# Patient Record
Sex: Male | Born: 1950 | Race: White | Hispanic: No | Marital: Married | Smoking: Never smoker
Health system: Southern US, Community
[De-identification: ages and names within clinical notes are randomized; demographics above are authoritative.]

## PROBLEM LIST (undated history)

## (undated) DIAGNOSIS — E785 Hyperlipidemia, unspecified: Secondary | ICD-10-CM

## (undated) DIAGNOSIS — R42 Dizziness and giddiness: Secondary | ICD-10-CM

## (undated) DIAGNOSIS — I4819 Other persistent atrial fibrillation: Secondary | ICD-10-CM

## (undated) DIAGNOSIS — K219 Gastro-esophageal reflux disease without esophagitis: Secondary | ICD-10-CM

## (undated) DIAGNOSIS — R001 Bradycardia, unspecified: Secondary | ICD-10-CM

## (undated) HISTORY — DX: Bradycardia, unspecified: R00.1

## (undated) HISTORY — DX: Gastro-esophageal reflux disease without esophagitis: K21.9

## (undated) HISTORY — DX: Other persistent atrial fibrillation: I48.19

## (undated) HISTORY — DX: Dizziness and giddiness: R42

## (undated) HISTORY — DX: Hyperlipidemia, unspecified: E78.5

## (undated) HISTORY — PX: TONSILLECTOMY: SUR1361

## (undated) HISTORY — PX: COLONOSCOPY: SHX174

---

## 2008-08-30 ENCOUNTER — Ambulatory Visit: Payer: Self-pay | Admitting: Cardiology

## 2008-08-31 ENCOUNTER — Encounter: Payer: Self-pay | Admitting: Cardiology

## 2008-09-05 ENCOUNTER — Encounter (INDEPENDENT_AMBULATORY_CARE_PROVIDER_SITE_OTHER): Payer: Self-pay | Admitting: *Deleted

## 2008-09-05 LAB — CONVERTED CEMR LAB
AST: 20 units/L
Alkaline Phosphatase: 46 units/L
BUN: 14 mg/dL
Basophils Absolute: 0.1 10*3/uL
Basophils Relative: 1 %
Calcium: 9.6 mg/dL
Cholesterol: 210 mg/dL
Creatinine, Ser: 1.26 mg/dL
Glucose, Bld: 94 mg/dL
HCT: 46 %
MCHC: 33.9 g/dL
MCV: 97.9 fL
Platelets: 226 10*3/uL
Potassium: 4.4 meq/L
RDW: 12.8 %
TSH: 3.753 microintl units/mL
Triglycerides: 149 mg/dL

## 2008-09-15 ENCOUNTER — Ambulatory Visit: Payer: Self-pay | Admitting: Cardiology

## 2008-10-06 ENCOUNTER — Ambulatory Visit: Payer: Self-pay | Admitting: Cardiology

## 2009-10-25 ENCOUNTER — Encounter (INDEPENDENT_AMBULATORY_CARE_PROVIDER_SITE_OTHER): Payer: Self-pay | Admitting: *Deleted

## 2009-10-26 ENCOUNTER — Ambulatory Visit: Payer: Self-pay | Admitting: Cardiology

## 2009-10-26 ENCOUNTER — Encounter (INDEPENDENT_AMBULATORY_CARE_PROVIDER_SITE_OTHER): Payer: Self-pay | Admitting: *Deleted

## 2009-10-26 DIAGNOSIS — K219 Gastro-esophageal reflux disease without esophagitis: Secondary | ICD-10-CM

## 2009-10-26 DIAGNOSIS — E785 Hyperlipidemia, unspecified: Secondary | ICD-10-CM

## 2009-12-05 ENCOUNTER — Encounter: Payer: Self-pay | Admitting: Cardiology

## 2009-12-05 LAB — CONVERTED CEMR LAB
HDL: 33 mg/dL — ABNORMAL LOW (ref >39)
Total CHOL/HDL Ratio: 4.6

## 2009-12-11 ENCOUNTER — Encounter (INDEPENDENT_AMBULATORY_CARE_PROVIDER_SITE_OTHER): Payer: Self-pay | Admitting: *Deleted

## 2010-04-10 NOTE — Letter (Signed)
Summary: [place] [facility] Work Engineer, agricultural at [employer]  [address] [place] [address]   Phone: [phone]  Fax: [phone]     October 26, 2009 MRN: [id number]   Keith Luna [address] [place], [place]  [address]      YOUR LAB WORK IS DUE   November 27, 2009  Please go to [facility], located across the street from [facility] on the second floor.  Hours are Monday - Friday 7am until 7:30pm         Saturday 8am until 12noon    _X_  DO NOT EAT OR DRINK AFTER MIDNIGHT EVENING PRIOR TO LABWORK  __ YOUR LABWORK IS NOT FASTING --YOU MAY EAT PRIOR TO LABWORK

## 2010-04-10 NOTE — Miscellaneous (Signed)
Summary: LABS CBCD,CMP,LIPIDS,09/05/2008  Clinical Lists Changes  Observations: Added new observation of CALCIUM: 9.6 mg/dL (16/12/9602 54:09) Added new observation of ALBUMIN: 4.2 g/dL (81/19/1478 29:56) Added new observation of PROTEIN, TOT: 6.8 g/dL (21/30/8657 84:69) Added new observation of SGPT (ALT): 20 units/L (09/05/2008 16:41) Added new observation of SGOT (AST): 20 units/L (09/05/2008 16:41) Added new observation of ALK PHOS: 46 units/L (09/05/2008 16:41) Added new observation of CREATININE: 1.26 mg/dL (62/95/2841 32:44) Added new observation of BUN: 14 mg/dL (03/13/7251 66:44) Added new observation of BG RANDOM: 94 mg/dL (03/47/4259 56:38) Added new observation of CO2 PLSM/SER: 23 meq/L (09/05/2008 16:41) Added new observation of CL SERUM: 105 meq/L (09/05/2008 16:41) Added new observation of K SERUM: 4.4 meq/L (09/05/2008 16:41) Added new observation of NA: 141 meq/L (09/05/2008 16:41) Added new observation of LDL: 135 mg/dL (75/64/3329 51:88) Added new observation of HDL: 45 mg/dL (41/66/0630 16:01) Added new observation of TRIGLYC TOT: 149 mg/dL (09/32/3557 32:20) Added new observation of CHOLESTEROL: 210 mg/dL (25/42/7062 37:62) Added new observation of TSH: 3.753 microintl units/mL (09/05/2008 16:41) Added new observation of ABSOLUTE BAS: 0.1 K/uL (09/05/2008 16:41) Added new observation of BASOPHIL %: 1 % (09/05/2008 16:41) Added new observation of EOS ABSLT: 0.1 K/uL (09/05/2008 16:41) Added new observation of % EOS AUTO: 2 % (09/05/2008 16:41) Added new observation of ABSOLUTE MON: 0.5 K/uL (09/05/2008 16:41) Added new observation of MONOCYTE %: 8 % (09/05/2008 16:41) Added new observation of ABS LYMPHOCY: 1.8 K/uL (09/05/2008 16:41) Added new observation of LYMPHS %: 31 % (09/05/2008 16:41) Added new observation of PLATELETK/UL: 226 K/uL (09/05/2008 16:41) Added new observation of RDW: 12.8 % (09/05/2008 16:41) Added new observation of MCHC RBC: 33.9 g/dL  (83/15/1761 60:73) Added new observation of MCV: 97.9 fL (09/05/2008 16:41) Added new observation of HCT: 46.0 % (09/05/2008 16:41) Added new observation of HGB: 15.6 g/dL (71/08/2692 85:46) Added new observation of RBC M/UL: 4.70 M/uL (09/05/2008 16:41) Added new observation of WBC COUNT: 5.8 10*3/microliter (09/05/2008 16:41)

## 2010-04-10 NOTE — Miscellaneous (Signed)
Summary: GXT 10/06/08  Clinical Lists Changes  Observations: Added new observation of ETTFINDING:  REFERRING PHYSICIAN:  Fara Chute      CLINICAL DATA:  A 60 year old gentleman with a history of dizziness and   syncope, who had no increase in heart rate on a 6-minute walk test   performed recently.   1. Treadmill exercise performed to a workload of 12.5 METS and a heart       rate of 164, 101% of age predicted maximum.  Exercise discontinued       due to dyspnea and fatigue; no chest discomfort reported.   2. Blood pressure increased from a resting value of 150/80-170/80 at       peak exercise, a normal response.   3. No arrhythmias noted.   4. EKG:  Sinus bradycardia; within normal limits.      STRESS EKG:  Insignificant upsloping ST-segment depression resolving   quickly in recovery.      IMPRESSION:  Negative and adequate graded exercise test.            Gerrit Friends. Dietrich Pates, MD, [facility]  (10/06/2008 16:40)      Exercise Stress Test  Procedure date:  10/06/2008  Findings:       REFERRING PHYSICIAN:  Fara Chute      CLINICAL DATA:  A 60 year old gentleman with a history of dizziness and   syncope, who had no increase in heart rate on a 6-minute walk test   performed recently.   1. Treadmill exercise performed to a workload of 12.5 METS and a heart       rate of 164, 101% of age predicted maximum.  Exercise discontinued       due to dyspnea and fatigue; no chest discomfort reported.   2. Blood pressure increased from a resting value of 150/80-170/80 at       peak exercise, a normal response.   3. No arrhythmias noted.   4. EKG:  Sinus bradycardia; within normal limits.      STRESS EKG:  Insignificant upsloping ST-segment depression resolving   quickly in recovery.      IMPRESSION:  Negative and adequate graded exercise test.            Gerrit Friends. Dietrich Pates, MD, [facility]

## 2010-04-10 NOTE — Assessment & Plan Note (Signed)
Summary: F1Y   Visit Type:  Follow-up Primary Provider:  Dr. Fara Chute   History of Present Illness: Keith Luna returns to the office as scheduled for reassessment of orthostatic lightheadedness.  Since increasing salt intake, symptoms have been minimal.  Keith Luna has not experienced syncope nor falls.  He is active, maintaining a large garden, without any difficulty or any cardiopulmonary symptoms.  He has been so compliant with his high salt diet that he has developed mild peripheral edema.  Current Medications (verified): 1)  Lipitor 40 Mg Tabs (Atorvastatin Calcium) .... Take One Tablet By Mouth Daily. 2)  Nexium 40 Mg Pack (Esomeprazole Magnesium) .... Take 1 Tab Daily 3)  Tylenol 325 Mg Tabs (Acetaminophen) .... Take As Needed  Allergies (verified): No Known Drug Allergies  Past History:  PMH, FH, and Social History reviewed and updated.  Past Medical History: Orthostatic lightheadedness; transient neurologic symptoms-2010 Hyperlipidemia Gastroesophageal reflux disease  Review of Systems       The patient complains of peripheral edema.  The patient denies anorexia, weight loss, weight gain, vision loss, decreased hearing, hoarseness, chest pain, syncope, dyspnea on exertion, prolonged cough, headaches, hemoptysis, abdominal pain, and melena.    Vital Signs:  Patient profile:   60 year old male Height:      70 inches Weight:      209 pounds BMI:     30.10 Pulse rate:   56 / minute Pulse (ortho):   51 / minute BP sitting:   111 / 70  (right arm) BP standing:   112 / 74  Vitals Entered By: Keith Saa, CNA (October 26, 2009 1:37 PM)  Serial Vital Signs/Assessments:  Time      Position  BP       Pulse  Resp  Temp     By 2:23 PM   Lying RA  119/74   52                    Keith Sanders RN 2:23 PM   Sitting   120/79   55                    Keith Lower RN 2:23 PM   Standing  112/74   51                    Keith Sanders RN  Comments: No symptoms during  ortho vs By: Keith Lower RN    Physical Exam  General:  Pleasant; proportionate weight and height; well developed; no acute distress:   Neck-No JVD; no carotid bruits: Lungs-No tachypnea, no rales; no rhonchi; no wheezes: Cardiovascular-normal PMI; normal S1 and S2; minimal systolic ejection murmur Abdomen-BS normal; soft and non-tender without masses or organomegaly:  Musculoskeletal-No deformities, no cyanosis or clubbing: Neurologic-Normal cranial nerves; symmetric strength and tone:  Skin-Warm, no significant lesions: Extremities-Nl distal pulses; trace edema:     Impression & Recommendations:  Problem # 1:  ORTHOSTATIC DIZZINESS/VERTIGO (ICD-780.4) Symptoms have resolved; no significant orthostatic change is noted at this visit.  Patient will adjust salt intake to minimize the development of edema while maintaining symptoms at bay.  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  It appears that the sinus node is responding well to positive chronotropic stimulation.  His resting sinus bradycardia appears to be a normal variant and is not causing problems at present.  Problem # 3:  HYPERLIPIDEMIA (ICD-272.4) His most recent lipid profile showed suboptimal control of hyperlipidemia.  Since he is taking an expensive agent, he might as well achieve a greater benefit.  His dose of atorvastatin will be increased to 40 mg q.d. with a repeat lipid profile in one month.  Routine cardiology followup does not appear necessary at the present time.  Keith Luna other medical providers should feel free to call me if I can be of any assistance in his care.  Other Orders: Future Orders: T-Lipid Profile ([id number]) ... 11/27/2009  Patient Instructions: 1)  Your physician recommends that  you schedule a follow-up appointment in: as needed 2)  Your physician recommends that you return for lab work in:1 month 3)  Your physician has requested that you decrease the amount of potassium in your diet. Please see [facility] handout. 4)  may decrease potassium chloride in diet when ankle edema occurs Prescriptions: LIPITOR 40 MG TABS (ATORVASTATIN CALCIUM) Take one tablet by mouth daily.  #30 x 6   Entered by:   Keith Lower RN   Authorized by:   Keith Brunswick, MD, [facility]   Signed by:   Keith Lower RN on 10/26/2009   Method used:   Electronically to        [facility] [address] (retail)       [address]       [place], [place]  [address]       Ph: [phone]       Fax: [phone]   RxID:   [id number]

## 2010-04-10 NOTE — Letter (Signed)
Summary: [place] Results Engineer, agricultural at [facility]  [address], [place] [address]   Phone: [phone]  Fax: [phone]      December 11, 2009 MRN: [id number]   Keith Luna [address] [place], [place]  [address]   Dear Mr. Sooy,  Your test ordered by Selena Batten has been reviewed by your physician (or physician assistant) and was found to be normal or stable. Your physician (or physician assistant) felt no changes were needed at this time.  ____ Echocardiogram  ____ Cardiac Stress Test  __x__ Lab Work  ____ Peripheral vascular study of arms, legs or neck  ____ CT scan or X-ray  ____ Lung or Breathing test  ____ Other:  No change in medical treatment at this time, per Dr. Dietrich Pates.  Enclosed is a copy of your labwork for your records.  Thank you, Tammy Allyne Gee RN    [place] Bing, MD, [facility].C.Gaylord Shih, MD, F.A.C.C Lewayne Bunting, MD, F.A.C.C Nona Dell, MD, F.A.C.C Charlton Haws, MD, [facility] Arena.C.C

## 2010-07-24 NOTE — Assessment & Plan Note (Signed)
[facility]                       [place] [facility] OFFICE NOTE   JAYQUAN, BRADSHER                         MRN:          [id number]  DATE:09/15/2008                            DOB:          01-04-1951    PRIMARY CARDIOLOGIST:  Gerrit Friends. Dietrich Pates, MD, [facility].   PRIMARY CARE PHYSICIAN:  Fara Chute at [place], [place].   OFFICE VISIT HISTORY OF PRESENT ILLNESS:  This is a very pleasant 60-  year-old Caucasian male patient who presents to the office today at our  request because he has been wearing a CardioNet monitor secondary to a  history of syncope.  He was hospitalized at [facility]  on June 28, 2008, secondary to this.  He apparently slumped over while  eating and was found to be bradycardic and hypotensive.  The patient was  seen by Dr. [place] Bing in the hospital and was placed on a  CardioNet on followup.  The patient's heart rate was found to be in the  30s throughout the monitoring and his CardioNet monitor continued to  beep each time he did so.  The technician from CardioNet did notify Dr.  Marvel Plan office and strips were faxed and he indeed was in sinus  rhythm, sinus brady with a heart rate in the 30s.  The patient has been  asymptomatic since being discharged.  There was no evidence of heart  block on the CardioNet strips or pauses.  The patient went from normal  sinus rhythm with a heart rate in the 60s to a heart rate in the low 30s  per cardionet monitoring documentation.  The patient states the only  time he feels any different is when he gets up from a lying or sitting  position to a standing position and he does feel little lightheaded,  sometimes dizzy, but he has not had any further syncopal episodes.  When  he was seen in the office earlier, he was asked to walk up and down the  hall very quickly to evaluate the patient's heart rate, and it did not  go up any higher than 62 after walking very  quickly up and down the  hallway.  Also, the patient did have some evidence of hypotension but no  significant orthostatic hypotension.   The patient was just brought in the office today to evaluate him and see  if he was symptomatic.  I have gone over the telemetry strips and let  him know that his heart rate has been low in the 30s but no evidence of  pauses or tachyarrhythmias.  He is to continue to wear the CardioNet  monitor for an additional week and follow up in the office as previously  scheduled.  I have advised the patient not to drive long distances as  this resting heart rate may lower and cause him to pass out during  driving.  The patient verbalizes understanding.  He has been advised  that if he has any new symptoms of syncope or chest pain, he is to call  our office immediately.  His wife states that she would  be calling 911  and bring him to the hospital.  At this time, there has been no change  in his medications.  They have been made aware of his bradycardia, and  he will follow up with Dr. Dietrich Pates on a previously scheduled  appointment for discussion of further recommendations concerning the  patient's bradycardia.   This is a low-level office visit for discussion on the patient's  symptoms.      Bettey Mare. Lyman Bishop, NP  Electronically Signed      Jesse Sans. Daleen Squibb, MD, [facility]  Electronically Signed   KML/MedQ  DD: 09/15/2008  DT: 09/16/2008  Job #: [id number]   cc:   Fara Chute

## 2010-07-24 NOTE — Letter (Signed)
August 30, 2008    Fara Chute  [address] [place], [place] [address]   RE:  Keith Luna, Keith Luna  MRN:  [id number]  /  DOB:  1950-03-18   Dear Renae Fickle:   Mr. Dollens is evaluated in the office at your request today for  orthostatic lightheadedness and syncope.  Mr. Lenderman has enjoyed generally  excellent health.  He has mild hyperlipidemia and GERD.  His only  surgery has been a tonsillectomy as a child.  He has noted orthostatic  dizziness over the past few months, particularly when arising from bed  in the morning.  He is symptomatic for a few seconds, and then reverts  to his normal sense of well-being.  He experienced an episode of syncope  a few months ago when he was seated at the table.  He became  unresponsive to his wife who summoned EMS.  By the time they arrived, he  still was not responsive, but gradually came around.  They assessed his  blood pressure, but did not allow him to assume a recumbent position.  We have no report of their findings.  He had been working in the sun  that day and also consumed 5 beers according to the emergency department  note, which was obtained and reviewed.  There is no report of  hypotension.  He was amnesiac for the events of that day subsequent to  the ER visit and continues to be so.  He experienced a third type of  episode this past week when he developed vertigo that requiring him to  lie down for number of hours, during which time symptoms persisted.  He  had nausea accompanying that sensation, but no emesis.  He has no prior  history of loss of consciousness or vertigo.   PAST MEDICAL HISTORY:  Benign, except as noted above.   MEDICATIONS:  His only medications are:  1. Atorvastatin 10 mg daily.  2. Aspirin 325 mg daily.  3. Nexium 40 mg daily.   ALLERGIES:  He has no known allergies.   SOCIAL HISTORY:  Married and resides in [place]; retired from employment  with [employer].  He remains active including doing  substantial yard  work, generally without difficulty.  He sometime notes  fatigue and dyspnea with moderate exertion.  He has 2 adult children.   FAMILY HISTORY:  Father died at age 69 due to CHF; mother died at age 69  due to CHF.  Two siblings are alive and well.   REVIEW OF SYSTEMS:  Notable for intermittent headaches, the need for  corrective lenses for near vision, stable weight and appetite and mild  intermittent pedal edema.  All other systems reviewed and are negative.   PHYSICAL EXAMINATION:  GENERAL:  Pleasant gentleman in no acute  distress.  VITAL SIGNS:  The weight is 210 pounds, blood pressure 110/70 without  orthostatic change, heart rate 50 and regular, respirations 12 and  unlabored.  HEENT:  Anicteric sclerae; pupils are equal, round, and reactive to  light; EOMs intact; normal oral mucosa.  NECK:  No jugular venous distention; normal carotid upstrokes without  bruits.  ENDOCRINE:  No thyromegaly.  HEMATOPOIETIC:  No adenopathy.  LUNGS:  Clear.  SKIN:  Tanned.  CARDIAC:  Normal first and second heart sounds; no gallop, rub nor  murmur appreciated.  ABDOMEN:  Soft and nontender; normal bowel sounds; no organomegaly.  EXTREMITIES:  No edema; normal distal pulses.  NEUROLOGIC:  Symmetric strength and tone;  normal cranial nerves.   A 6-minute walk was performed.  The patient covered 1200 feet.  Heart  rate essentially did not change, remaining 50 beats per minute  immediately after exercise.   Laboratory results are available from the emergency department  assessment in April.  CBC was normal.  BMET was normal.  Urine analysis  was normal.  CT scan of the brain was normal.  Drug screen was negative.  Blood alcohol was 0.07.   EKG:  Sinus bradycardia; borderline left atrial abnormality; borderline  right ventricular conduction delay; borderline low voltage in the chest  leads; nondiagnostic lateral Q-waves; otherwise normal.  No prior  tracing for comparison.   IMPRESSION:  Mr.  Loewen has had fairly long-standing symptoms suggesting  orthostatic hypotension without loss of consciousness.  He now has  suffered a syncopal spell after a day of work outside and significant,  if not excessive amount of alcohol.  He is not orthostatic in the office  today, but certainly could be after hours of no PO intake during the  night or after dehydration related to physical activity combined with  alcohol consumption.  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  Although, I doubt that his loss of  consciousness was related to arrhythmia, since he does have fairly  marked sinus bradycardia and chronotropic incompetence, 3 weeks of event  recording will be performed.  A TSH level will be measured.  There is  nothing on his exam to suggest congestive heart failure or valvular  heart disease.  His EKG is essentially normal.  We will defer cardiac  imaging for the time being.  Mr. Vanderpol has been provided with information  regarding an increased salt diet and also advised to keep his fluid  intake up.   His case was discussed with Dr. Graciela Husbands, our electrophysiologist.  He  suggests a stress test at some point to determine if an even higher  level of exercise will result in heart rate acceleration and to document  whether or not he has significant limitation of exercise tolerance due  to his chronotropic intolerance.  I will see this nice gentleman again  in 1 month.    Sincerely,      Gerrit Friends. Dietrich Pates, MD, [facility]  Electronically Signed    RMR/MedQ  DD: 08/30/2008  DT: 08/31/2008  Job #: [id number]

## 2010-07-24 NOTE — Letter (Signed)
October 06, 2008    Fara Chute, MD  [address]  [place], [place] [address]   RE:  MILON, DETHLOFF  MRN:  [id number]  /  DOB:  1950/05/25   Dear Renae Fickle:   [name] returns to the office for continued assessment and treatment of  dizziness and syncope.  Since his last visit, he has been virtually  asymptomatic.  He has had a few spells of minimal lightheadedness.  He  has increased his salt intake and intake of fluids.  He has been more  careful about working on hot days.  He notes no chest discomfort.  He  reports dyspnea on moderate to marked exertion.   Medications are unchanged.   On exam, pleasant gentleman in no acute distress.  The weight is 210,  unchanged.  Blood pressure 120/75, heart rate 55 and regular,  respirations 13 and unlabored.  Neck:  No jugular venous distention.  Lungs:  Clear.  Cardiac:  Normal first and second heart sounds; no  gallop nor rub.  Extremities:  No edema.   An event recorder was carried for 3 weeks.  Multiple automatic  transmissions documented sinus bradycardia with rates as low as 37 beats  per minute.  He had some PACs.  He had 2 episodes of dizziness without  correlation between symptoms and rhythm.  There were occasional periods  of normal sinus rhythm with heart rates as high as 100.   Basic laboratory studies were normal including TSH.   A graded exercise test was performed.  His heart rate increased to 155  beats per minute at peak exercise of 12.5 METs.  There were no EKG  changes to suggest ischemia.  He stopped with fatigue and dyspnea.   IMPRESSION:  Mr. Keehan appears to have responded well to an increasing  salt and fluid intake.  Although he had marked chronotropic incompetence  on a casual stress test, the first time I saw him, his heart rate  response was normal today.  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  For now, he will continue the course that was  laid out at our  last visit.  I will see him again in 1 year unless he call sooner with  recurrence of symptoms.  Thank so much for sending this nice gentleman  to see me.    Sincerely,      Gerrit Friends. Dietrich Pates, MD, [facility]  Electronically Signed    RMR/MedQ  DD: 10/06/2008  DT: 10/07/2008  Job #: [id number]

## 2010-07-24 NOTE — Procedures (Signed)
[facility]                              EXERCISE TREADMILL   JANOAH, MENNA                         MRN:          [id number]  DATE:10/06/2008                            DOB:          [birth year]    REFERRING PHYSICIAN:  Fara Chute   CLINICAL DATA:  A 60 year old gentleman with a history of dizziness and  syncope, who had no increase in heart rate on a 6-minute walk test  performed recently.  1. Treadmill exercise performed to a workload of 12.5 METS and a heart      rate of 164, 101% of age predicted maximum.  Exercise discontinued      due to dyspnea and fatigue; no chest discomfort reported.  2. Blood pressure increased from a resting value of 150/80-170/80 at      peak exercise, a normal response.  3. No arrhythmias noted.  4. EKG:  Sinus bradycardia; within normal limits.   STRESS EKG:  Insignificant upsloping ST-segment depression resolving  quickly in recovery.   IMPRESSION:  Negative and adequate graded exercise test.     Gerrit Friends. Dietrich Pates, MD, [facility]  Electronically Signed    RMR/MedQ  DD: 10/06/2008  DT: 10/07/2008  Job #: [id number]   cc:   Fara Chute

## 2011-02-28 ENCOUNTER — Encounter: Payer: Self-pay | Admitting: Cardiology

## 2011-07-15 ENCOUNTER — Other Ambulatory Visit: Payer: Self-pay | Admitting: Cardiology

## 2011-09-10 ENCOUNTER — Ambulatory Visit (INDEPENDENT_AMBULATORY_CARE_PROVIDER_SITE_OTHER): Payer: BC Managed Care – PPO | Admitting: Adult Health

## 2011-09-10 ENCOUNTER — Encounter: Payer: Self-pay | Admitting: Adult Health

## 2011-09-10 VITALS — BP 128/82 | HR 48 | Ht 71.0 in | Wt 204.0 lb

## 2011-09-10 DIAGNOSIS — R001 Bradycardia, unspecified: Secondary | ICD-10-CM | POA: Insufficient documentation

## 2011-09-10 DIAGNOSIS — E782 Mixed hyperlipidemia: Secondary | ICD-10-CM

## 2011-09-10 DIAGNOSIS — I495 Sick sinus syndrome: Secondary | ICD-10-CM

## 2011-09-10 DIAGNOSIS — E785 Hyperlipidemia, unspecified: Secondary | ICD-10-CM

## 2011-09-10 DIAGNOSIS — I498 Other specified cardiac arrhythmias: Secondary | ICD-10-CM

## 2011-09-10 DIAGNOSIS — R42 Dizziness and giddiness: Secondary | ICD-10-CM

## 2011-09-10 MED ORDER — ATORVASTATIN CALCIUM 40 MG PO TABS: 40.0000 mg | ORAL_TABLET | Freq: Every day | ORAL | Status: DC

## 2011-09-10 MED ORDER — ESOMEPRAZOLE MAGNESIUM 40 MG PO CPDR: 40.0000 mg | DELAYED_RELEASE_CAPSULE | Freq: Every day | ORAL | Status: DC

## 2011-09-10 NOTE — Patient Instructions (Signed)
Your physician recommends that you schedule a follow-up appointment in: 1 year  Your physician recommends that you return for lab work in: Today   

## 2011-09-10 NOTE — Assessment & Plan Note (Signed)
He is completely asymptomatic with this HR.  He is requesting labs for his PCP to include PSA, TSH. I will have these drawn as well as CBC to be thorough. Will see him in one year unless symptomatic.

## 2011-09-10 NOTE — Assessment & Plan Note (Signed)
Fasting lipids and LFT's will be completed for ongoing assessment of status. These will be sent to Dr.Sasser, PCP as well for his review.  No myalgias associated with statin.

## 2011-09-10 NOTE — Progress Notes (Signed)
   HPI: Keith Luna is a 61 y/o patient of Dr. Dietrich Pates, looking younger than stated age, for ongoing annual assessment of asymptomatic bradycardia, hypercholesterolemia, and orthostatic dizziness. He was advised to continue salt intake and watch cholesterol rich foods. He has been without complaint. He has not had any hospitalization, ER visits or new diagnosis since last visit. He has some occasional dizziness if he gets up too fast but otherwise remains active working in his garden.   Not on File  Current Outpatient Prescriptions  Medication Sig Dispense Refill  . acetaminophen (TYLENOL) 325 MG tablet Take 650 mg by mouth as needed.        Marland Kitchen atorvastatin (LIPITOR) 40 MG tablet Take 1 tablet (40 mg total) by mouth daily.  90 tablet  3  . esomeprazole (NEXIUM) 40 MG capsule Take 1 capsule (40 mg total) by mouth daily before breakfast.  90 capsule  3  . DISCONTD: atorvastatin (LIPITOR) 40 MG tablet TAKE 1 TABLET BY MOUTH DAILY  30 tablet  0  . DISCONTD: esomeprazole (NEXIUM) 40 MG capsule Take 40 mg by mouth daily before breakfast.          Past Medical History  Diagnosis Date  . Orthostatic lightheadedness   . Hyperlipidemia   . GERD (gastroesophageal reflux disease)     Past Surgical History  Procedure Date  . Tonsillectomy     UJW:[id number] of systems complete and found to be negative unless listed above  PHYSICAL EXAM BP 128/82  Pulse 48  Ht 5\' 11"  (1.803 m)  Wt 204 lb (92.534 kg)  BMI 28.45 kg/m2  General: Well developed, well nourished, in no acute distress Head: Eyes PERRLA, No xanthomas.   Normal cephalic and atramatic  Lungs: Clear bilaterally to auscultation and percussion. Heart: HRRR S1 S2, bradycardic without MRG.  Pulses are 2+ & equal.            No carotid bruit. No JVD.  No abdominal bruits. No femoral bruits. Abdomen: Bowel sounds are positive, abdomen soft and non-tender without masses or                  Hernia's noted. Msk:  Back normal, normal gait. Normal  strength and tone for age. Extremities: No clubbing, cyanosis or edema.  DP +1 Neuro: Alert and oriented X 3. Psych:  Good affect, responds appropriately  EKG: Sinus bradycardia rate of 50 bpm  ASSESSMENT AND PLAN

## 2011-09-20 ENCOUNTER — Telehealth: Payer: Self-pay

## 2011-09-20 NOTE — Telephone Encounter (Signed)
**Note De-Identified Keith Luna Obfuscation** Pt. cannot afford Nexium as it cost $116.58 for a 30 day supply and wants to know if this can be changed to Omeprazole 20 mg daily. Please advise./LV

## 2011-09-23 MED ORDER — OMEPRAZOLE 20 MG PO CPDR: 20.0000 mg | DELAYED_RELEASE_CAPSULE | Freq: Every day | ORAL | Status: DC

## 2011-09-23 NOTE — Telephone Encounter (Signed)
**Note De-Identified Keith Luna Obfuscation** Pt. Advised, he verbalized understanding. RX sent to [facility] in [place] to fill./LV

## 2011-09-23 NOTE — Telephone Encounter (Signed)
Ok to change to less expensive version.

## 2011-09-23 NOTE — Addendum Note (Signed)
**Note De-Identified Keith Luna Obfuscation** Addended by: Demetrios Loll on: 09/23/2011 09:43 AM   Modules accepted: Orders

## 2011-09-30 ENCOUNTER — Encounter: Payer: Self-pay | Admitting: Adult Health

## 2012-12-28 ENCOUNTER — Other Ambulatory Visit: Payer: Self-pay | Admitting: Adult Health

## 2012-12-29 NOTE — Telephone Encounter (Signed)
Noted pt overdue for f/u, scheduled pt for earliest apt with Dr Wyline Mood 02-22-13 11:20am, pt understood

## 2013-02-22 ENCOUNTER — Ambulatory Visit: Payer: BC Managed Care – PPO | Admitting: Cardiology

## 2013-02-24 ENCOUNTER — Encounter: Payer: Self-pay | Admitting: Cardiology

## 2013-02-24 ENCOUNTER — Ambulatory Visit (INDEPENDENT_AMBULATORY_CARE_PROVIDER_SITE_OTHER): Payer: BC Managed Care – PPO | Admitting: Cardiology

## 2013-02-24 VITALS — BP 130/80 | HR 47 | Ht 71.0 in | Wt 211.0 lb

## 2013-02-24 DIAGNOSIS — R001 Bradycardia, unspecified: Secondary | ICD-10-CM

## 2013-02-24 DIAGNOSIS — I10 Essential (primary) hypertension: Secondary | ICD-10-CM

## 2013-02-24 DIAGNOSIS — R42 Dizziness and giddiness: Secondary | ICD-10-CM | POA: Insufficient documentation

## 2013-02-24 DIAGNOSIS — E782 Mixed hyperlipidemia: Secondary | ICD-10-CM

## 2013-02-24 DIAGNOSIS — I498 Other specified cardiac arrhythmias: Secondary | ICD-10-CM

## 2013-02-24 LAB — CBC
MCH: 33.5 pg (ref 26.0–34.0)
MCHC: 35.1 g/dL (ref 30.0–36.0)
Platelets: 224 10*3/uL (ref 150–400)
RBC: 4.62 MIL/uL (ref 4.22–5.81)

## 2013-02-24 LAB — COMPREHENSIVE METABOLIC PANEL
Albumin: 4.1 g/dL (ref 3.5–5.2)
Alkaline Phosphatase: 46 U/L (ref 39–117)
Chloride: 104 mEq/L (ref 96–112)
Glucose, Bld: 92 mg/dL (ref 70–99)
Potassium: 4.5 mEq/L (ref 3.5–5.3)
Sodium: 138 mEq/L (ref 135–145)
Total Protein: 6.6 g/dL (ref 6.0–8.3)

## 2013-02-24 LAB — LIPID PANEL
Cholesterol: 170 mg/dL (ref 0–200)
Total CHOL/HDL Ratio: 3.8 Ratio

## 2013-02-24 NOTE — Progress Notes (Signed)
     Clinical Summary [name] is a 62 y.o.male former patient of Dr Dietrich Pates this is our first visit together. He was seen for the following medical problems  1. Orthostatic lightheadedness - symptoms improved after increasing salt intake previously - denies any significant recent symptoms  2. Sinus bradycardia - normal HR response on prior exercise test - denies any significant symptoms  3.Hyperlipidemia - compliant with lipitor, denies any significant side effects - last panel 09/2011: TC 188 HDL 51 TG 124 LDL 112   Past Medical History  Diagnosis Date  . Orthostatic lightheadedness   . Hyperlipidemia   . GERD (gastroesophageal reflux disease)      Not on File   Current Outpatient Prescriptions  Medication Sig Dispense Refill  . acetaminophen (TYLENOL) 325 MG tablet Take 650 mg by mouth as needed.        Marland Kitchen atorvastatin (LIPITOR) 40 MG tablet TAKE 1 TABLET EVERY DAY  90 tablet  0  . omeprazole (PRILOSEC) 20 MG capsule Take 1 capsule (20 mg total) by mouth daily.  30 capsule  11   No current facility-administered medications for this visit.     Past Surgical History  Procedure Laterality Date  . Tonsillectomy       Not on File    Family History  Problem Relation Age of Onset  . Heart failure Mother   . Heart failure Father      Social History [name] reports that he has never smoked. He does not have any smokeless tobacco history on file. [name] reports that he does not drink alcohol.   Review of Systems CONSTITUTIONAL: No weight loss, fever, chills, weakness or fatigue.  HEENT: Eyes: No visual loss, blurred vision, double vision or yellow sclerae.No hearing loss, sneezing, congestion, runny nose or sore throat.  SKIN: No rash or itching.  CARDIOVASCULAR: no chest pain, no palpitations RESPIRATORY: No shortness of breath, cough or sputum.  GASTROINTESTINAL: No anorexia, nausea, vomiting or diarrhea. No abdominal pain or blood.  GENITOURINARY: No  burning on urination, no polyuria NEUROLOGICAL: No headache, dizziness, syncope, paralysis, ataxia, numbness or tingling in the extremities. No change in bowel or bladder control.  MUSCULOSKELETAL: No muscle, back pain, joint pain or stiffness.  LYMPHATICS: No enlarged nodes. No history of splenectomy.  PSYCHIATRIC: No history of depression or anxiety.  ENDOCRINOLOGIC: No reports of sweating, cold or heat intolerance. No polyuria or polydipsia.  Marland Kitchen   Physical Examination p 47 bp 130/80 Wt 211 lbs BMI 29 Gen: resting comfortably, no acute distress HEENT: no scleral icterus, pupils equal round and reactive, no palptable cervical adenopathy,  CV: RRR, no m/r/g, no JVD, no carotid bruits Resp: Clear to auscultation bilaterally GI: abdomen is soft, non-tender, non-distended, normal bowel sounds, no hepatosplenomegaly MSK: extremities are warm, no edema.  Skin: warm, no rash Neuro:  no focal deficits Psych: appropriate affect   Diagnostic Studies 02/24/13 Clinic EKG: sinus bradycardia, pr 150 ms    Assessment and Plan  1. Orthostatic dizziness - symptoms resolved after increasing daily salt intake - continue to follow clinically  2. Sinus bradycardia - no current symptoms, normal chronotropic response on prior testing - continue to follow clinically  3. Hyperlipidemia - continue current statin - recheck lipid panel      Antoine Poche, M.D., F.A.C.C.

## 2013-02-24 NOTE — Patient Instructions (Addendum)
Your physician wants you to follow-up in: ONE YEAR You will receive a reminder letter in the mail two months in advance. If you don't receive a letter, please call our office to schedule the follow-up appointment.  Your physician recommends that you return for lab work in: THIS WEEK ( SLIPS GIVEN FOR  CMET, FLP,CBC)  WE WILL CALL YOU WITH YOUR TEST RESULTS/INSTRUCTIONS/NEXT STEPS ONCE RECEIVED BY THE PROVIDER

## 2013-03-05 ENCOUNTER — Encounter: Payer: Self-pay | Admitting: *Deleted

## 2013-09-17 ENCOUNTER — Other Ambulatory Visit: Payer: Self-pay | Admitting: Adult Health

## 2014-04-22 ENCOUNTER — Encounter: Payer: Self-pay | Admitting: Cardiology

## 2014-04-22 ENCOUNTER — Ambulatory Visit (INDEPENDENT_AMBULATORY_CARE_PROVIDER_SITE_OTHER): Payer: BLUE CROSS/BLUE SHIELD | Admitting: Cardiology

## 2014-04-22 VITALS — BP 114/71 | HR 58 | Ht 71.0 in | Wt 204.0 lb

## 2014-04-22 DIAGNOSIS — R001 Bradycardia, unspecified: Secondary | ICD-10-CM

## 2014-04-22 DIAGNOSIS — Z139 Encounter for screening, unspecified: Secondary | ICD-10-CM

## 2014-04-22 DIAGNOSIS — E785 Hyperlipidemia, unspecified: Secondary | ICD-10-CM

## 2014-04-22 MED ORDER — ATORVASTATIN CALCIUM 40 MG PO TABS: 20.0000 mg | ORAL_TABLET | Freq: Every day | ORAL | Status: DC

## 2014-04-22 NOTE — Patient Instructions (Signed)
   Labs for CMET, Magnesium, CBC, TSH, FLP, HgA1C Office will contact with results via phone or letter.   Continue all current medications.  Your physician wants you to follow up in:  1 year.  You will receive a reminder letter in the mail one-two months in advance.  If you don't receive a letter, please call our office to schedule the follow up appointment

## 2014-04-22 NOTE — Progress Notes (Signed)
Clinical Summary Mr. Degan is a 64 y.o.male seen today for follow up of the following medical problems.   1. Orthostatic dizzines - symptoms improved after increasing salt intake previously - denies any significant recent symptoms since last visit  2. Sinus bradycardia - normal HR response on prior exercise test - denies any significant symptoms  3.Hyperlipidemia - compliant with lipitor, denies any significant side effects - last panel 09/2011: TC 188 HDL 51 TG 124 LDL 112    Past Medical History  Diagnosis Date  . Orthostatic lightheadedness   . Hyperlipidemia   . GERD (gastroesophageal reflux disease)      Not on File   Current Outpatient Prescriptions  Medication Sig Dispense Refill  . acetaminophen (TYLENOL) 325 MG tablet Take 650 mg by mouth as needed.      Marland Kitchen atorvastatin (LIPITOR) 40 MG tablet TAKE 1 TABLET EVERY DAY 90 tablet 3  . omeprazole (PRILOSEC) 20 MG capsule Take 1 capsule (20 mg total) by mouth daily. 30 capsule 11   No current facility-administered medications for this visit.     Past Surgical History  Procedure Laterality Date  . Tonsillectomy       Not on File    Family History  Problem Relation Age of Onset  . Heart failure Mother   . Heart failure Father      Social History Mr. Scioneaux reports that he has never smoked. He does not have any smokeless tobacco history on file. Mr. Timothy reports that he does not drink alcohol.   Review of Systems CONSTITUTIONAL: No weight loss, fever, chills, weakness or fatigue.  HEENT: Eyes: No visual loss, blurred vision, double vision or yellow sclerae.No hearing loss, sneezing, congestion, runny nose or sore throat.  SKIN: No rash or itching.  CARDIOVASCULAR: per HPI RESPIRATORY: No shortness of breath, cough or sputum.  GASTROINTESTINAL: No anorexia, nausea, vomiting or diarrhea. No abdominal pain or blood.  GENITOURINARY: No burning on urination, no polyuria NEUROLOGICAL: No headache,  dizziness, syncope, paralysis, ataxia, numbness or tingling in the extremities. No change in bowel or bladder control.  MUSCULOSKELETAL: No muscle, back pain, joint pain or stiffness.  LYMPHATICS: No enlarged nodes. No history of splenectomy.  PSYCHIATRIC: No history of depression or anxiety.  ENDOCRINOLOGIC: No reports of sweating, cold or heat intolerance. No polyuria or polydipsia.  Marland Kitchen   Physical Examination p 58 bp 114/71 Wt 204 lbs BMI 28 Gen: resting comfortably, no acute distress HEENT: no scleral icterus, pupils equal round and reactive, no palptable cervical adenopathy,  CV: RRR, no m/r/g, no JVD, no carotid bruits Resp: Clear to auscultation bilaterally GI: abdomen is soft, non-tender, non-distended, normal bowel sounds, no hepatosplenomegaly MSK: extremities are warm, no edema.  Skin: warm, no rash Neuro:  no focal deficits Psych: appropriate affect   Diagnostic Studies 09/2008 [facility] CLINICAL DATA: A 64 year old gentleman with a history of dizziness and syncope, who had no increase in heart rate on a 6-minute walk test performed recently. 1. Treadmill exercise performed to a workload of 12.5 METS and a heart  rate of 164, 101% of age predicted maximum. Exercise discontinued  due to dyspnea and fatigue; no chest discomfort reported. 2. Blood pressure increased from a resting value of 150/80-170/80 at  peak exercise, a normal response. 3. No arrhythmias noted. 4. EKG: Sinus bradycardia; within normal limits.  STRESS EKG: Insignificant upsloping ST-segment depression resolving quickly in recovery.  IMPRESSION: Negative and adequate graded exercise test.    Assessment and Plan  1. Orthostatic dizziness - symptoms resolved after increasing daily salt intake - continue to follow clinically  2. Sinus bradycardia - no current symptoms, normal chronotropic response on prior testing - continue to follow clinically  3. Hyperlipidemia -  continue current statin - recheck lipid panel   F/u 1 year   Arnoldo Lenis, M.D.

## 2014-04-26 ENCOUNTER — Other Ambulatory Visit: Payer: Self-pay | Admitting: Cardiology

## 2014-04-26 LAB — HEMOGLOBIN A1C
Hgb A1c MFr Bld: 5.7 % — ABNORMAL HIGH (ref <5.7)
Mean Plasma Glucose: 117 mg/dL — ABNORMAL HIGH (ref <117)

## 2014-04-26 LAB — CBC
HCT: 45.2 % (ref 39.0–52.0)
HEMOGLOBIN: 15.4 g/dL (ref 13.0–17.0)
MCH: 32.4 pg (ref 26.0–34.0)
MCHC: 34.1 g/dL (ref 30.0–36.0)
MCV: 95 fL (ref 78.0–100.0)
MPV: 10.8 fL (ref 8.6–12.4)
Platelets: 228 10*3/uL (ref 150–400)
RBC: 4.76 MIL/uL (ref 4.22–5.81)
RDW: 13.6 % (ref 11.5–15.5)
WBC: 5.6 10*3/uL (ref 4.0–10.5)

## 2014-04-26 LAB — COMPREHENSIVE METABOLIC PANEL
ALK PHOS: 45 U/L (ref 39–117)
ALT: 18 U/L (ref 0–53)
AST: 18 U/L (ref 0–37)
Albumin: 3.7 g/dL (ref 3.5–5.2)
BUN: 12 mg/dL (ref 6–23)
CALCIUM: 9 mg/dL (ref 8.4–10.5)
CO2: 26 mEq/L (ref 19–32)
Chloride: 106 mEq/L (ref 96–112)
Creat: 1.1 mg/dL (ref 0.50–1.35)
Glucose, Bld: 99 mg/dL (ref 70–99)
Potassium: 4 mEq/L (ref 3.5–5.3)
Sodium: 140 mEq/L (ref 135–145)
Total Bilirubin: 0.5 mg/dL (ref 0.2–1.2)
Total Protein: 6.4 g/dL (ref 6.0–8.3)

## 2014-04-26 LAB — LIPID PANEL
Cholesterol: 171 mg/dL (ref 0–200)
HDL: 42 mg/dL (ref >39)
LDL Cholesterol: 104 mg/dL — ABNORMAL HIGH (ref 0–99)
Total CHOL/HDL Ratio: 4.1 Ratio
Triglycerides: 127 mg/dL (ref <150)
VLDL: 25 mg/dL (ref 0–40)

## 2014-04-26 LAB — MAGNESIUM: Magnesium: 2 mg/dL (ref 1.5–2.5)

## 2014-04-26 LAB — TSH: TSH: 2.505 u[IU]/mL (ref 0.350–4.500)

## 2014-04-27 ENCOUNTER — Telehealth: Payer: Self-pay | Admitting: *Deleted

## 2014-04-27 NOTE — Telephone Encounter (Signed)
-----   Message from Arnoldo Lenis, MD sent at 04/27/2014 11:55 AM EST ----- Labs overall look good. One blood test (HgbA1c) is slightly above normal suggesting very early changes of prediabetes. He needs to continue working on exercise and weight loss to prevent this progressing into full diabetes  Zandra Abts MD

## 2014-04-27 NOTE — Telephone Encounter (Signed)
Pt made aware, forwarded to Dr. Quintin Alto

## 2014-11-25 DIAGNOSIS — Z23 Encounter for immunization: Secondary | ICD-10-CM | POA: Diagnosis not present

## 2014-12-27 DIAGNOSIS — Z23 Encounter for immunization: Secondary | ICD-10-CM | POA: Diagnosis not present

## 2015-06-05 ENCOUNTER — Encounter: Payer: Self-pay | Admitting: Cardiology

## 2015-06-05 ENCOUNTER — Ambulatory Visit (INDEPENDENT_AMBULATORY_CARE_PROVIDER_SITE_OTHER): Payer: Medicare Other | Admitting: Cardiology

## 2015-06-05 VITALS — BP 125/80 | HR 62 | Ht 71.0 in | Wt 213.0 lb

## 2015-06-05 DIAGNOSIS — R42 Dizziness and giddiness: Secondary | ICD-10-CM

## 2015-06-05 DIAGNOSIS — E785 Hyperlipidemia, unspecified: Secondary | ICD-10-CM

## 2015-06-05 DIAGNOSIS — R001 Bradycardia, unspecified: Secondary | ICD-10-CM

## 2015-06-05 NOTE — Progress Notes (Addendum)
Patient ID: Keith Luna, male   DOB: 12-07-1950, 65 y.o.   MRN: [id number]     Clinical Summary Keith Luna is a 65 y.o.male seen today for follow up of the following medical problems.    1. Orthostatic dizzines - symptoms improved after increasing salt intake  - denies any recent symptoms  2. Sinus bradycardia - normal HR response on prior exercise test - notes some low heart rates at time. Episode of feeling tired 2 weeks, felt pulse and was 44. These episodes occur several months apart   3.Hyperlipidemia - compliant with lipitor, denies any significant side effects - has labs pending with pcp next month     Past Medical History  Diagnosis Date  . Orthostatic lightheadedness   . Hyperlipidemia   . GERD (gastroesophageal reflux disease)      No Known Allergies   Current Outpatient Prescriptions  Medication Sig Dispense Refill  . atorvastatin (LIPITOR) 40 MG tablet Take 0.5 tablets (20 mg total) by mouth daily.    Marland Kitchen omeprazole (PRILOSEC) 20 MG capsule Take 20 mg by mouth daily.  3   No current facility-administered medications for this visit.     Past Surgical History  Procedure Laterality Date  . Tonsillectomy       No Known Allergies    Family History  Problem Relation Age of Onset  . Heart failure Mother   . Heart failure Father      Social History Keith Luna reports that he has never smoked. He has never used smokeless tobacco. Keith Luna reports that he does not drink alcohol.   Review of Systems CONSTITUTIONAL: occasional fatigue HEENT: Eyes: No visual loss, blurred vision, double vision or yellow sclerae.No hearing loss, sneezing, congestion, runny nose or sore throat.  SKIN: No rash or itching.  CARDIOVASCULAR: no chest pain, no palpitations.  RESPIRATORY: No shortness of breath, cough or sputum.  GASTROINTESTINAL: No anorexia, nausea, vomiting or diarrhea. No abdominal pain or blood.  GENITOURINARY: No burning on urination, no  polyuria NEUROLOGICAL: No headache, dizziness, syncope, paralysis, ataxia, numbness or tingling in the extremities. No change in bowel or bladder control.  MUSCULOSKELETAL: No muscle, back pain, joint pain or stiffness.  LYMPHATICS: No enlarged nodes. No history of splenectomy.  PSYCHIATRIC: No history of depression or anxiety.  ENDOCRINOLOGIC: No reports of sweating, cold or heat intolerance. No polyuria or polydipsia.  Marland Kitchen   Physical Examination Filed Vitals:   06/05/15 1528  BP: 125/80  Pulse: 62   Filed Vitals:   06/05/15 1528  Height: 5\' 11"  (1.803 m)  Weight: 213 lb (96.616 kg)    Gen: resting comfortably, no acute distress HEENT: no scleral icterus, pupils equal round and reactive, no palptable cervical adenopathy,  CV: RRR, no m/r/g, no jvd Resp: Clear to auscultation bilaterally GI: abdomen is soft, non-tender, non-distended, normal bowel sounds, no hepatosplenomegaly MSK: extremities are warm, no edema.  Skin: warm, no rash Neuro:  no focal deficits Psych: appropriate affect   Diagnostic Studies 09/2008 [facility] CLINICAL DATA: A 65 year old gentleman with a history of dizziness and syncope, who had no increase in heart rate on a 6-minute walk test performed recently. 1. Treadmill exercise performed to a workload of 12.5 METS and a heart  rate of 164, 101% of age predicted maximum. Exercise discontinued  due to dyspnea and fatigue; no chest discomfort reported. 2. Blood pressure increased from a resting value of 150/80-170/80 at  peak exercise, a normal response. 3. No arrhythmias noted. 4. EKG: Sinus bradycardia;  within normal limits.  STRESS EKG: Insignificant upsloping ST-segment depression resolving quickly in recovery.  IMPRESSION: Negative and adequate graded exercise test.     Assessment and Plan  1. Orthostatic dizziness - symptoms resolved after increasing daily salt intake - continue to monitor  2. Sinus bradycardia -  normal chronotropic response on prior testing. EKG in clinic today shows normal sinus rhythm is rate low 60s - reports very infrequent low heart rates at home. We will continue to monitor, he likely has some conduction disease but is not at the point of intervention.   3. Hyperlipidemia - continue current statin - has labs with pcp later this month per his report    F/u 1 year  Arnoldo Lenis, M.D.

## 2015-06-05 NOTE — Patient Instructions (Signed)

## 2015-07-21 DIAGNOSIS — Z13220 Encounter for screening for lipoid disorders: Secondary | ICD-10-CM | POA: Diagnosis not present

## 2015-07-21 DIAGNOSIS — Z205 Contact with and (suspected) exposure to viral hepatitis: Secondary | ICD-10-CM | POA: Diagnosis not present

## 2015-07-21 DIAGNOSIS — Z0000 Encounter for general adult medical examination without abnormal findings: Secondary | ICD-10-CM | POA: Diagnosis not present

## 2015-07-21 DIAGNOSIS — R5383 Other fatigue: Secondary | ICD-10-CM | POA: Diagnosis not present

## 2015-07-21 DIAGNOSIS — R5382 Chronic fatigue, unspecified: Secondary | ICD-10-CM | POA: Diagnosis not present

## 2015-07-21 DIAGNOSIS — Z0001 Encounter for general adult medical examination with abnormal findings: Secondary | ICD-10-CM | POA: Diagnosis not present

## 2015-07-21 DIAGNOSIS — Z6830 Body mass index (BMI) 30.0-30.9, adult: Secondary | ICD-10-CM | POA: Diagnosis not present

## 2015-07-26 DIAGNOSIS — Z23 Encounter for immunization: Secondary | ICD-10-CM | POA: Diagnosis not present

## 2015-07-26 DIAGNOSIS — K210 Gastro-esophageal reflux disease with esophagitis: Secondary | ICD-10-CM | POA: Diagnosis not present

## 2015-07-26 DIAGNOSIS — Z1212 Encounter for screening for malignant neoplasm of rectum: Secondary | ICD-10-CM | POA: Diagnosis not present

## 2015-07-26 DIAGNOSIS — E782 Mixed hyperlipidemia: Secondary | ICD-10-CM | POA: Diagnosis not present

## 2015-07-26 DIAGNOSIS — Z0001 Encounter for general adult medical examination with abnormal findings: Secondary | ICD-10-CM | POA: Diagnosis not present

## 2015-07-26 DIAGNOSIS — R001 Bradycardia, unspecified: Secondary | ICD-10-CM | POA: Diagnosis not present

## 2015-09-29 DIAGNOSIS — R195 Other fecal abnormalities: Secondary | ICD-10-CM | POA: Diagnosis not present

## 2015-10-16 DIAGNOSIS — R195 Other fecal abnormalities: Secondary | ICD-10-CM | POA: Diagnosis not present

## 2015-10-16 DIAGNOSIS — D125 Benign neoplasm of sigmoid colon: Secondary | ICD-10-CM | POA: Diagnosis not present

## 2015-10-16 DIAGNOSIS — Z808 Family history of malignant neoplasm of other organs or systems: Secondary | ICD-10-CM | POA: Diagnosis not present

## 2015-10-16 DIAGNOSIS — D123 Benign neoplasm of transverse colon: Secondary | ICD-10-CM | POA: Diagnosis not present

## 2015-10-16 DIAGNOSIS — E785 Hyperlipidemia, unspecified: Secondary | ICD-10-CM | POA: Diagnosis not present

## 2015-10-16 DIAGNOSIS — Z79899 Other long term (current) drug therapy: Secondary | ICD-10-CM | POA: Diagnosis not present

## 2015-10-16 DIAGNOSIS — K635 Polyp of colon: Secondary | ICD-10-CM | POA: Diagnosis not present

## 2015-10-16 DIAGNOSIS — K5730 Diverticulosis of large intestine without perforation or abscess without bleeding: Secondary | ICD-10-CM | POA: Diagnosis not present

## 2015-10-16 DIAGNOSIS — K219 Gastro-esophageal reflux disease without esophagitis: Secondary | ICD-10-CM | POA: Diagnosis not present

## 2015-12-27 DIAGNOSIS — Z23 Encounter for immunization: Secondary | ICD-10-CM | POA: Diagnosis not present

## 2016-01-15 DIAGNOSIS — Z23 Encounter for immunization: Secondary | ICD-10-CM | POA: Diagnosis not present

## 2016-01-23 ENCOUNTER — Other Ambulatory Visit: Payer: Self-pay | Admitting: Cardiology

## 2016-04-22 ENCOUNTER — Other Ambulatory Visit: Payer: Self-pay | Admitting: Cardiology

## 2016-07-20 ENCOUNTER — Other Ambulatory Visit: Payer: Self-pay | Admitting: Cardiology

## 2016-08-01 DIAGNOSIS — K210 Gastro-esophageal reflux disease with esophagitis: Secondary | ICD-10-CM | POA: Diagnosis not present

## 2016-08-01 DIAGNOSIS — E782 Mixed hyperlipidemia: Secondary | ICD-10-CM | POA: Diagnosis not present

## 2016-08-01 DIAGNOSIS — R5383 Other fatigue: Secondary | ICD-10-CM | POA: Diagnosis not present

## 2016-08-01 DIAGNOSIS — R001 Bradycardia, unspecified: Secondary | ICD-10-CM | POA: Diagnosis not present

## 2016-08-06 DIAGNOSIS — K210 Gastro-esophageal reflux disease with esophagitis: Secondary | ICD-10-CM | POA: Diagnosis not present

## 2016-08-06 DIAGNOSIS — R7301 Impaired fasting glucose: Secondary | ICD-10-CM | POA: Diagnosis not present

## 2016-08-06 DIAGNOSIS — Z1389 Encounter for screening for other disorder: Secondary | ICD-10-CM | POA: Diagnosis not present

## 2016-08-06 DIAGNOSIS — Z0001 Encounter for general adult medical examination with abnormal findings: Secondary | ICD-10-CM | POA: Diagnosis not present

## 2016-08-06 DIAGNOSIS — D128 Benign neoplasm of rectum: Secondary | ICD-10-CM | POA: Diagnosis not present

## 2016-08-06 DIAGNOSIS — N401 Enlarged prostate with lower urinary tract symptoms: Secondary | ICD-10-CM | POA: Diagnosis not present

## 2016-08-06 DIAGNOSIS — Z6828 Body mass index (BMI) 28.0-28.9, adult: Secondary | ICD-10-CM | POA: Diagnosis not present

## 2016-08-06 DIAGNOSIS — E782 Mixed hyperlipidemia: Secondary | ICD-10-CM | POA: Diagnosis not present

## 2016-08-23 ENCOUNTER — Other Ambulatory Visit: Payer: Self-pay | Admitting: Cardiology

## 2016-12-23 DIAGNOSIS — Z23 Encounter for immunization: Secondary | ICD-10-CM | POA: Diagnosis not present

## 2017-05-26 DIAGNOSIS — J0101 Acute recurrent maxillary sinusitis: Secondary | ICD-10-CM | POA: Diagnosis not present

## 2017-05-26 DIAGNOSIS — Z6827 Body mass index (BMI) 27.0-27.9, adult: Secondary | ICD-10-CM | POA: Diagnosis not present

## 2017-05-26 DIAGNOSIS — J069 Acute upper respiratory infection, unspecified: Secondary | ICD-10-CM | POA: Diagnosis not present

## 2017-08-01 DIAGNOSIS — R5383 Other fatigue: Secondary | ICD-10-CM | POA: Diagnosis not present

## 2017-08-01 DIAGNOSIS — N401 Enlarged prostate with lower urinary tract symptoms: Secondary | ICD-10-CM | POA: Diagnosis not present

## 2017-08-01 DIAGNOSIS — E782 Mixed hyperlipidemia: Secondary | ICD-10-CM | POA: Diagnosis not present

## 2017-08-01 DIAGNOSIS — K210 Gastro-esophageal reflux disease with esophagitis: Secondary | ICD-10-CM | POA: Diagnosis not present

## 2017-08-06 DIAGNOSIS — D128 Benign neoplasm of rectum: Secondary | ICD-10-CM | POA: Diagnosis not present

## 2017-08-06 DIAGNOSIS — E782 Mixed hyperlipidemia: Secondary | ICD-10-CM | POA: Diagnosis not present

## 2017-08-06 DIAGNOSIS — N401 Enlarged prostate with lower urinary tract symptoms: Secondary | ICD-10-CM | POA: Diagnosis not present

## 2017-08-06 DIAGNOSIS — R7301 Impaired fasting glucose: Secondary | ICD-10-CM | POA: Diagnosis not present

## 2017-08-06 DIAGNOSIS — K210 Gastro-esophageal reflux disease with esophagitis: Secondary | ICD-10-CM | POA: Diagnosis not present

## 2017-08-06 DIAGNOSIS — Z0000 Encounter for general adult medical examination without abnormal findings: Secondary | ICD-10-CM | POA: Diagnosis not present

## 2017-08-06 DIAGNOSIS — Z681 Body mass index (BMI) 19 or less, adult: Secondary | ICD-10-CM | POA: Diagnosis not present

## 2017-08-06 DIAGNOSIS — K429 Umbilical hernia without obstruction or gangrene: Secondary | ICD-10-CM | POA: Diagnosis not present

## 2017-10-28 ENCOUNTER — Other Ambulatory Visit: Payer: Self-pay | Admitting: Cardiology

## 2017-12-08 DIAGNOSIS — Z23 Encounter for immunization: Secondary | ICD-10-CM | POA: Diagnosis not present

## 2018-08-05 DIAGNOSIS — R5383 Other fatigue: Secondary | ICD-10-CM | POA: Diagnosis not present

## 2018-08-05 DIAGNOSIS — E782 Mixed hyperlipidemia: Secondary | ICD-10-CM | POA: Diagnosis not present

## 2018-08-05 DIAGNOSIS — K210 Gastro-esophageal reflux disease with esophagitis: Secondary | ICD-10-CM | POA: Diagnosis not present

## 2018-08-05 DIAGNOSIS — R7301 Impaired fasting glucose: Secondary | ICD-10-CM | POA: Diagnosis not present

## 2018-08-11 DIAGNOSIS — N401 Enlarged prostate with lower urinary tract symptoms: Secondary | ICD-10-CM | POA: Diagnosis not present

## 2018-08-11 DIAGNOSIS — E782 Mixed hyperlipidemia: Secondary | ICD-10-CM | POA: Diagnosis not present

## 2018-08-11 DIAGNOSIS — R7301 Impaired fasting glucose: Secondary | ICD-10-CM | POA: Diagnosis not present

## 2018-08-11 DIAGNOSIS — K210 Gastro-esophageal reflux disease with esophagitis: Secondary | ICD-10-CM | POA: Diagnosis not present

## 2018-08-11 DIAGNOSIS — D128 Benign neoplasm of rectum: Secondary | ICD-10-CM | POA: Diagnosis not present

## 2018-08-11 DIAGNOSIS — Z6826 Body mass index (BMI) 26.0-26.9, adult: Secondary | ICD-10-CM | POA: Diagnosis not present

## 2018-08-11 DIAGNOSIS — K429 Umbilical hernia without obstruction or gangrene: Secondary | ICD-10-CM | POA: Diagnosis not present

## 2018-09-08 DIAGNOSIS — E860 Dehydration: Secondary | ICD-10-CM | POA: Diagnosis not present

## 2018-09-08 DIAGNOSIS — R001 Bradycardia, unspecified: Secondary | ICD-10-CM | POA: Diagnosis not present

## 2018-09-08 DIAGNOSIS — Z6825 Body mass index (BMI) 25.0-25.9, adult: Secondary | ICD-10-CM | POA: Diagnosis not present

## 2018-09-08 DIAGNOSIS — R55 Syncope and collapse: Secondary | ICD-10-CM | POA: Diagnosis not present

## 2018-10-27 ENCOUNTER — Encounter: Payer: Self-pay | Admitting: *Deleted

## 2018-10-27 NOTE — Progress Notes (Signed)
Cardiology Office Note  Date: 10/28/2018   ID: Keith MATHES Sr., DOB 1951/02/05, MRN [id number]  PCP:  Manon Hilding, MD  Consulting Cardiologist:  Carlyle Dolly, MD Electrophysiologist:  None   Chief Complaint  Patient presents with  . History of syncope    History of Present Illness: Keith KLINGENSMITH Sr. is a 68 y.o. male patient of Dr. Harl Bowie last seen in the office in March 2017.  He is referred back to the office by Dr. Quintin Alto and requested to see me.  He presents reporting an episode of syncope that occurred about 1 month ago.  He tells me that he had not eaten breakfast or had anything to drink that morning, was working outside in the yard, came inside feeling dizzy, and when he sat down he apparently blacked out.  He states that his wife witnessed the event, when he came around he gradually felt better, had something to eat and drink.  He does not report any sense of palpitations at the time, and has had no subsequent events.  I reviewed the patient's chart, he has a history of orthostatic dizziness and also sinus bradycardia with no clear evidence of chronotropic incompetence by [facility] in the past on evaluation by Dr. Harl Bowie.  He does not report any history of tickborne illness, is not on any AV nodal blockers.  I personally reviewed his ECG from June of this year which showed sinus bradycardia at 49 bpm, otherwise normal intervals.   He has a watch that records heart rate.  States that it does get into the mid 40s when he is asleep at nighttime, but otherwise during the daytime is up in the 50s to 70s.  He was not found to be orthostatic today on examination.  Past Medical History:  Diagnosis Date  . GERD (gastroesophageal reflux disease)   . Hyperlipidemia   . Orthostatic lightheadedness     Past Surgical History:  Procedure Laterality Date  . TONSILLECTOMY      Current Outpatient Medications  Medication Sig Dispense Refill  . atorvastatin (LIPITOR) 20 MG tablet  Take 10 mg by mouth daily.    Marland Kitchen omeprazole (PRILOSEC) 20 MG capsule Take 20 mg by mouth daily.  3   No current facility-administered medications for this visit.    Allergies:  Patient has no known allergies.   Social History: The patient  reports that he has never smoked. He has never used smokeless tobacco. He reports that he does not drink alcohol or use drugs.   Family History: The patient's family history includes Asthma in his mother; Cancer in his father; Heart attack in his sister; Heart failure in his father; Hyperlipidemia in his sister.   ROS:  Please see the history of present illness. Otherwise, complete review of systems is positive for none.  All other systems are reviewed and negative.   Physical Exam: VS:  BP 128/62   Pulse (!) 53   Temp 98.7 F (37.1 C)   Ht 5\' 11"  (1.803 m)   Wt 193 lb (87.5 kg)   SpO2 97%   BMI 26.92 kg/m , BMI Body mass index is 26.92 kg/m.  Wt Readings from Last 3 Encounters:  10/28/18 193 lb (87.5 kg)  06/05/15 213 lb (96.6 kg)  04/22/14 204 lb (92.5 kg)    General: Patient appears comfortable at rest. HEENT: Conjunctiva and lids normal, wearing a mask. Neck: Supple, no elevated JVP or carotid bruits, no thyromegaly. Lungs: Clear to auscultation,  nonlabored breathing at rest. Cardiac: Regular rate and rhythm, no S3 or significant systolic murmur. Abdomen: Soft, nontender, bowel sounds present. Extremities: No pitting edema, distal pulses 2+. Skin: Warm and dry. Musculoskeletal: No kyphosis. Neuropsychiatric: Alert and oriented x3, affect grossly appropriate.  ECG:  An ECG dated 06/05/2015 was personally reviewed today and demonstrated:  Normal sinus rhythm.  Recent Labwork:    Component Value Date/Time   CHOL 171 04/26/2014 1002   TRIG 127 04/26/2014 1002   HDL 42 04/26/2014 1002   CHOLHDL 4.1 04/26/2014 1002   VLDL 25 04/26/2014 1002   LDLCALC 104 (H) 04/26/2014 1002  July 2020: TSH 3.31, BUN 14, creatinine 1.19, potassium  4.0, AST 14, ALT 14, potassium 2.0, hemoglobin 14.9, platelets 232 May 2020: Cholesterol 212, triglycerides 114, HDL 39, LDL 150  Other Studies Reviewed Today:  No interval cardiac testing for review.  Assessment and Plan:  1.  Episode of reported syncope approximately 1 month ago in the setting of apparent relative dehydration, possibly neurocardiogenic with reported history of orthostatic hypotension in the past.  He was not found to be orthostatic today and does not describe any symptoms subsequent to the event a month ago.  He does have bradycardia at baseline, but it is unlikely that this is the cause of the episode of syncope, unless he had a pause at that time.  Recent ECG shows sinus bradycardia with normal intervals.  No reported history of tickborne illness, thyroid disease, or sudden unexplained syncope.  Evaluation for chronotropic incompetence by G[facility] per Dr. Harl Bowie in the past was negative.  He is not on any AV nodal blockers.  For now would recommend observation.  If symptoms escalate next step would be follow-up cardiac monitoring.  2.  Mixed hyperlipidemia on Lipitor with follow-up by Dr. Quintin Alto.  LDL was 150 in May.  Medication Adjustments/Labs and Tests Ordered: Current medicines are reviewed at length with the patient today.  Concerns regarding medicines are outlined above.   Tests Ordered: No orders of the defined types were placed in this encounter.   Medication Changes: No orders of the defined types were placed in this encounter.   Disposition:  Follow up 1 year in the [place] office.  Signed, Satira Sark, MD, [facility] 10/28/2018 9:30 AM    [address] [facility] [address], [place], [place] [address] Phone: [phone]; Fax: [phone]

## 2018-10-28 ENCOUNTER — Ambulatory Visit (INDEPENDENT_AMBULATORY_CARE_PROVIDER_SITE_OTHER): Payer: Medicare Other | Admitting: Cardiology

## 2018-10-28 ENCOUNTER — Other Ambulatory Visit: Payer: Self-pay

## 2018-10-28 ENCOUNTER — Encounter: Payer: Self-pay | Admitting: Cardiology

## 2018-10-28 VITALS — BP 128/62 | HR 53 | Temp 98.7°F | Ht 71.0 in | Wt 193.0 lb

## 2018-10-28 DIAGNOSIS — R55 Syncope and collapse: Secondary | ICD-10-CM | POA: Diagnosis not present

## 2018-10-28 DIAGNOSIS — Z8679 Personal history of other diseases of the circulatory system: Secondary | ICD-10-CM

## 2018-10-28 DIAGNOSIS — E782 Mixed hyperlipidemia: Secondary | ICD-10-CM

## 2018-10-28 DIAGNOSIS — Z87898 Personal history of other specified conditions: Secondary | ICD-10-CM

## 2018-10-28 NOTE — Patient Instructions (Addendum)

## 2018-11-04 DIAGNOSIS — Z86010 Personal history of colonic polyps: Secondary | ICD-10-CM | POA: Diagnosis not present

## 2018-11-26 DIAGNOSIS — Z23 Encounter for immunization: Secondary | ICD-10-CM | POA: Diagnosis not present

## 2018-12-22 DIAGNOSIS — Z01818 Encounter for other preprocedural examination: Secondary | ICD-10-CM | POA: Diagnosis not present

## 2018-12-24 DIAGNOSIS — K5730 Diverticulosis of large intestine without perforation or abscess without bleeding: Secondary | ICD-10-CM | POA: Diagnosis not present

## 2018-12-24 DIAGNOSIS — Z86010 Personal history of colonic polyps: Secondary | ICD-10-CM | POA: Diagnosis not present

## 2018-12-24 DIAGNOSIS — D122 Benign neoplasm of ascending colon: Secondary | ICD-10-CM | POA: Diagnosis not present

## 2018-12-24 DIAGNOSIS — Z1211 Encounter for screening for malignant neoplasm of colon: Secondary | ICD-10-CM | POA: Diagnosis not present

## 2019-01-18 DIAGNOSIS — D126 Benign neoplasm of colon, unspecified: Secondary | ICD-10-CM | POA: Diagnosis not present

## 2019-04-01 DIAGNOSIS — Z23 Encounter for immunization: Secondary | ICD-10-CM | POA: Diagnosis not present

## 2019-05-07 DIAGNOSIS — Z23 Encounter for immunization: Secondary | ICD-10-CM | POA: Diagnosis not present

## 2020-01-13 DIAGNOSIS — Z23 Encounter for immunization: Secondary | ICD-10-CM | POA: Diagnosis not present

## 2020-01-19 DIAGNOSIS — Z23 Encounter for immunization: Secondary | ICD-10-CM | POA: Diagnosis not present

## 2020-06-06 DIAGNOSIS — E7849 Other hyperlipidemia: Secondary | ICD-10-CM | POA: Diagnosis not present

## 2020-06-06 DIAGNOSIS — I4891 Unspecified atrial fibrillation: Secondary | ICD-10-CM | POA: Insufficient documentation

## 2020-06-06 DIAGNOSIS — R42 Dizziness and giddiness: Secondary | ICD-10-CM | POA: Diagnosis not present

## 2020-06-06 DIAGNOSIS — R0602 Shortness of breath: Secondary | ICD-10-CM | POA: Diagnosis not present

## 2020-06-06 DIAGNOSIS — Z6826 Body mass index (BMI) 26.0-26.9, adult: Secondary | ICD-10-CM | POA: Diagnosis not present

## 2020-06-06 DIAGNOSIS — G459 Transient cerebral ischemic attack, unspecified: Secondary | ICD-10-CM | POA: Diagnosis not present

## 2020-06-06 DIAGNOSIS — J322 Chronic ethmoidal sinusitis: Secondary | ICD-10-CM | POA: Diagnosis not present

## 2020-06-06 DIAGNOSIS — J019 Acute sinusitis, unspecified: Secondary | ICD-10-CM | POA: Diagnosis not present

## 2020-06-06 DIAGNOSIS — J341 Cyst and mucocele of nose and nasal sinus: Secondary | ICD-10-CM | POA: Diagnosis not present

## 2020-06-06 DIAGNOSIS — J321 Chronic frontal sinusitis: Secondary | ICD-10-CM | POA: Diagnosis not present

## 2020-06-06 DIAGNOSIS — I6782 Cerebral ischemia: Secondary | ICD-10-CM | POA: Diagnosis not present

## 2020-06-06 DIAGNOSIS — G319 Degenerative disease of nervous system, unspecified: Secondary | ICD-10-CM | POA: Diagnosis not present

## 2020-06-08 ENCOUNTER — Ambulatory Visit (INDEPENDENT_AMBULATORY_CARE_PROVIDER_SITE_OTHER): Payer: Medicare Other

## 2020-06-08 ENCOUNTER — Encounter: Payer: Self-pay | Admitting: Family Medicine

## 2020-06-08 ENCOUNTER — Telehealth: Payer: Self-pay | Admitting: Family Medicine

## 2020-06-08 ENCOUNTER — Ambulatory Visit (INDEPENDENT_AMBULATORY_CARE_PROVIDER_SITE_OTHER): Payer: Medicare Other | Admitting: Family Medicine

## 2020-06-08 ENCOUNTER — Other Ambulatory Visit: Payer: Self-pay | Admitting: Cardiology

## 2020-06-08 VITALS — BP 94/62 | HR 95 | Ht 71.0 in | Wt 196.2 lb

## 2020-06-08 DIAGNOSIS — I4891 Unspecified atrial fibrillation: Secondary | ICD-10-CM

## 2020-06-08 DIAGNOSIS — R55 Syncope and collapse: Secondary | ICD-10-CM | POA: Diagnosis not present

## 2020-06-08 DIAGNOSIS — I495 Sick sinus syndrome: Secondary | ICD-10-CM

## 2020-06-08 DIAGNOSIS — E782 Mixed hyperlipidemia: Secondary | ICD-10-CM | POA: Diagnosis not present

## 2020-06-08 DIAGNOSIS — R0602 Shortness of breath: Secondary | ICD-10-CM | POA: Diagnosis not present

## 2020-06-08 DIAGNOSIS — R42 Dizziness and giddiness: Secondary | ICD-10-CM

## 2020-06-08 DIAGNOSIS — Z87898 Personal history of other specified conditions: Secondary | ICD-10-CM | POA: Diagnosis not present

## 2020-06-08 NOTE — Telephone Encounter (Signed)
Pre-cert Verification for the following procedure     14 day zio - AQ orderding   :

## 2020-06-08 NOTE — H&P (View-Only) (Signed)
Cardiology Office Note  Date: 06/10/2020   ID: HAWKE VILLALPANDO Sr., DOB 1950-03-23, MRN [id number]  PCP:  Manon Hilding, MD  Cardiologist:  Rozann Lesches, MD Electrophysiologist:  None   Chief Complaint: New onset Atrial fibrillation  History of Present Illness: Alric Seton Sr. is a 70 y.o. male with a history of GERD, HLD, bradycardia.  Previously seen by Dr Harl Bowie in 2017 for hx of orthostatic dizziness with symptom improvement after salt intake. Hx spiosdes of sinus bradycardia / low heart rates at times. Episodes several month apart. Pulses felt by patient of 43. Had complained of feeling tired for 2 weeks prior. On statin with no side effects.  Recent report of shortness of breath and dizziness possible TIA symptoms. Was seen by PCP, Dr Quintin Alto on 06/06/2020. Had MRI head with no evidence of acute infarct, masses, bleediing, extra-axial fluid . Minimal cerebral chronic white matter chronic small vessel ischemic disease slightly progressed from brain MRI 06/2008. Stable mild cerebral atrophy.  He is here with today referred by PCP for new onset atrial fibrillation. He brings with him an EKG from PCP office demonstrating atrial fibrillation with rate of 77. He was ordered Eliquis by PCP but stated he had noted started taking it yet. He was concerned about side effects and costs and wondered if there were any alternatives. He and wife were asking questions regarding options for treatment. They asked specifically about pacemaker and ablation procedures.   He currently denies and dizziness, shortness of breath, presyncope, or syncopal episodes. Denies any sensation of palpitations or arrhythmias. States he sometimes feels a skipped beat or pause and his heart rate is sometimes slow. He denies any CVA or TIA like symptoms or current orthostatic symptoms. BP is mid 94/62. Not on AV nodal blockers. He states normally his BP is around 563 systolic. Denies any ortohpnea or PND. No LE edema or  DVT/PE symptoms. We discussed rhythm versus rate control strategies for management. We discussed he would need at least 3 weeks of uninterupted systemic anticoagulation before cardioversion could be done. Patient and wife both verbalize understanding.   Past Medical History:  Diagnosis Date  . GERD (gastroesophageal reflux disease)   . Hyperlipidemia   . Orthostatic lightheadedness     Past Surgical History:  Procedure Laterality Date  . TONSILLECTOMY      Current Outpatient Medications  Medication Sig Dispense Refill  . atorvastatin (LIPITOR) 20 MG tablet Take 10 mg by mouth daily.    Marland Kitchen omeprazole (PRILOSEC) 20 MG capsule Take 20 mg by mouth daily.  3  . ELIQUIS 5 MG TABS tablet Take 5 mg by mouth 2 (two) times daily. (Patient not taking: Reported on 06/08/2020)     No current facility-administered medications for this visit.   Allergies:  Patient has no known allergies.   Social History: The patient  reports that he has never smoked. He has never used smokeless tobacco. He reports that he does not drink alcohol and does not use drugs.   Family History: The patient's family history includes Asthma in his mother; Cancer in his father; Heart attack in his sister; Heart failure in his father; Hyperlipidemia in his sister.   ROS:  Please see the history of present illness. Otherwise, complete review of systems is positive for none.  All other systems are reviewed and negative.   Physical Exam: VS:  BP 94/62   Pulse 95   Ht 5\' 11"  (1.803 m)   Wt 196 lb  3.2 oz (89 kg)   SpO2 95%   BMI 27.36 kg/m , BMI Body mass index is 27.36 kg/m.  Wt Readings from Last 3 Encounters:  06/08/20 196 lb 3.2 oz (89 kg)  10/28/18 193 lb (87.5 kg)  06/05/15 213 lb (96.6 kg)    General: Patient appears comfortable at rest. Neck: Supple, no elevated JVP or carotid bruits, no thyromegaly. Lungs: Clear to auscultation, nonlabored breathing at rest. Cardiac: irregularly irregular rate and rhythm, no  S3 or significant systolic murmur, no pericardial rub. Extremities: No pitting edema, distal pulses 2+. Skin: Warm and dry. Musculoskeletal: No kyphosis. Neuropsychiatric: Alert and oriented x3, affect grossly appropriate.  ECG:  An ECG dated 06/08/2020 was personally reviewed today and demonstrated:  atrial fibrillation rate of 77.   Recent Labwork: No results found for requested labs within last 8760 hours.     Component Value Date/Time   CHOL 171 04/26/2014 1002   TRIG 127 04/26/2014 1002   HDL 42 04/26/2014 1002   CHOLHDL 4.1 04/26/2014 1002   VLDL 25 04/26/2014 1002   LDLCALC 104 (H) 04/26/2014 1002    Other Studies Reviewed Today:  MRI Brain / Head CT 06/06/2020 Impression  Mildly motion degraded examination.   No evidence of acute intracranial abnormality. Specifically, there  is no evidence of acute or recent subacute infarction.   Minimal cerebral white matter chronic small vessel ischemic disease,  slightly progressedfrom the brain MRI of 07/06/2008.   Stable mild cerebral atrophy.   Mild paranasal sinus disease, as described.    Electronically Signed  By: Kellie Simmering DO  On: 06/06/2020 15:39  Narrative  CLINICAL DATA: Transient ischemic attack (TIA). Additional history  provided: Saturday patient reports shortness of breath and dizziness  with heart rate fluctuating and out of rhythm. Patient reports  symptoms have improved since Saturday.   EXAM:  MRI HEAD WITHOUT CONTRAST   TECHNIQUE:  Multiplanar, multiecho pulse sequences of the brain and surrounding  structures were obtained without intravenous contrast.   COMPARISON: Brain MRI 07/06/2008.   FINDINGS:  Brain:   Mild intermittent motion degradation. Most notably, there is mild  motion degradation of the axial diffusion-weighted sequence.   Minimal scattered and ill-defined T2/FLAIR hyperintensity within the  bilateral cerebral white matter, nonspecific but compatible with   chronic small vessel ischemic disease.   There is no acute infarct.   No evidence of intracranial mass.   No chronic intracranial blood products.   No extra-axial fluid collection.   No midline shift.   Partially empty sella turcica.   Vascular: Expected proximalarterial flow voids.   Skull and upper cervical spine: No focal marrow lesion.   Sinuses/Orbits: Visualized orbits show no acute finding. Trace left  frontal and bilateral ethmoid sinus mucosal thickening. Small right  maxillary sinus mucous retentioncyst.  Procedure Note  Lavetta Nielsen, DO - 06/06/2020  Formatting of this note might be different from the original.  CLINICAL DATA: Transient ischemic attack (TIA). Additional history  provided: Saturday patient reports shortness of breath and dizziness  with heart rate fluctuating and out of rhythm. Patient reports  symptoms have improved since Saturday.   EXAM:  MRI HEAD WITHOUT CONTRAST   TECHNIQUE:  Multiplanar, multiecho pulse sequences of the brain and surrounding  structures were obtained without intravenous contrast.   COMPARISON: Brain MRI 07/06/2008.   FINDINGS:  Brain:   Mild intermittent motion degradation. Most notably, there is mild  motion degradation of the axial diffusion-weighted sequence.   Minimal scattered  and ill-defined T2/FLAIR hyperintensity within the  bilateral cerebral white matter, nonspecific but compatible with  chronic small vessel ischemic disease.   There is no acute infarct.   No evidence of intracranial mass.   No chronic intracranial blood products.   No extra-axial fluid collection.   No midline shift.   Partially empty sella turcica.   Vascular: Expected proximal arterial flow voids.   Skull and upper cervical spine: No focal marrow lesion.   Sinuses/Orbits: Visualized orbits show no acute finding. Trace left  frontal and bilateral ethmoid sinus mucosal thickening. Small right  maxillary sinus  mucous retention cyst.   IMPRESSION:  Mildly motion degraded examination.   No evidence of acute intracranial abnormality. Specifically, there  is no evidence of acuteor recent subacute infarction.   Minimal cerebral white matter chronic small vessel ischemic disease,  slightly progressed from the brain MRI of 07/06/2008.   Stable mild cerebral atrophy.   Mild paranasal sinus disease, as described.     . 09/2008 GXT CLINICAL DATA: A 70 year old gentleman with a history of dizziness and syncope, who had no increase in heart rate on a 6-minute walk test performed recently. 1. Treadmill exercise performed to a workload of 12.5 METS and a heart  rate of 164, 101% of age predicted maximum. Exercise discontinued  due to dyspnea and fatigue; no chest discomfort reported. 2. Blood pressure increased from a resting value of 150/80-170/80 at  peak exercise, a normal response. 3. No arrhythmias noted. 4. EKG: Sinus bradycardia; within normal limits.  STRESS EKG: Insignificant upsloping ST-segment depression resolving quickly in recovery.  IMPRESSION: Negative and adequate graded exercise test.  Assessment and Plan:  1. Atrial fibrillation, unspecified type ([place])   2. Near syncope   3. SOB (shortness of breath)   4. Mixed hyperlipidemia   5. History of bradycardia    1. Atrial fibrillation, unspecified type ([place]) New onset atrial fibrillation discovered at PCP office after syncope / near syncopal episode, suspected TIA. EKG from PCP office dated 06/06/2020 with rate controlled atrial fibrillation date 06/06/2020 confirmed by EKG in our office today. Patient was ordered Eliquis by PCP but has not started yet. We discussed options for treatment. Patient is willing to undergo DCCV after 3 weeks of uninterrupted anticoagulation. He was concerned over the cost of medication and potential side effects. We discussed DOACs versus Coumadin. Patient will proceed with  Eliquis. Start Eliqus 5 mg po bid and we will follow up in 3 weeks. I will send Dr Domenic Polite today's note for advice on scheduling DCCV.   2. Near syncope Recent history of near syncope with suspected TIA. PCP ordered MRI. No evidence of CVA or TIA. See report above. No recurrent of near syncope  3. SOB (shortness of breath) Recent SOB associated with dizziness. No current SOB or DOE. Please get an echocardiogram.   4. Mixed hyperlipidemia Continue atorvastatin 20 mg po daily.  5. History of bradycardia Patient with recent hx of dizziness, SOB, near syncope and new onset atrial fibrillation. Recent reported skipped heart beats / pauses with associated dizziness. Please get 14 day Zio monitor to assess atrial fibrillation burden as well as complaint of pauses /skipped beats with dizziness.     Medication Adjustments/Labs and Tests Ordered: Current medicines are reviewed at length with the patient today.  Concerns regarding medicines are outlined above.   Disposition: Follow-up with Dr Domenic Polite or APP 3 weeks.  Signed, Levell July, NP 06/10/2020 5:44 PM    [facility]  [facility], [place], [place] [address] Phone: [phone]; Fax: [phone]

## 2020-06-08 NOTE — Patient Instructions (Signed)
Medication Instructions:   Begin Eliquis as already prescribed.   Continue all other medications.    Labwork: none  Testing/Procedures:  Your physician has requested that you have an echocardiogram. Echocardiography is a painless test that uses sound waves to create images of your heart. It provides your doctor with information about the size and shape of your heart and how well your heart's chambers and valves are working. This procedure takes approximately one hour. There are no restrictions for this procedure.  Your physician has recommended that you wear a 14 day event monitor. Event monitors are medical devices that record the heart's electrical activity. Doctors most often [place] these monitors to diagnose arrhythmias. Arrhythmias are problems with the speed or rhythm of the heartbeat. The monitor is a small, portable device. You can wear one while you do your normal daily activities. This is usually used to diagnose what is causing palpitations/syncope (passing out).  Office will contact with results via phone or letter.    Follow-Up: 4 weeks   Any Other Special Instructions Will Be Listed Below (If Applicable).  If you need a refill on your cardiac medications before your next appointment, please call your pharmacy.

## 2020-06-08 NOTE — Progress Notes (Addendum)
Cardiology Office Note  Date: 06/10/2020   ID: Keith PARDEE Sr., DOB [birth year], MRN [id number]  PCP:  Manon Hilding, MD  Cardiologist:  Rozann Lesches, MD Electrophysiologist:  None   Chief Complaint: New onset Atrial fibrillation  History of Present Illness: Keith Seton Sr. is a 70 y.o. male with a history of GERD, HLD, bradycardia.  Previously seen by Dr Harl Bowie in 2017 for hx of orthostatic dizziness with symptom improvement after salt intake. Hx spiosdes of sinus bradycardia / low heart rates at times. Episodes several month apart. Pulses felt by patient of 43. Had complained of feeling tired for 2 weeks prior. On statin with no side effects.  Recent report of shortness of breath and dizziness possible TIA symptoms. Was seen by PCP, Dr Quintin Alto on 06/06/2020. Had MRI head with no evidence of acute infarct, masses, bleediing, extra-axial fluid . Minimal cerebral chronic white matter chronic small vessel ischemic disease slightly progressed from brain MRI 06/2008. Stable mild cerebral atrophy.  He is here with today referred by PCP for new onset atrial fibrillation. He brings with him an EKG from PCP office demonstrating atrial fibrillation with rate of 77. He was ordered Eliquis by PCP but stated he had noted started taking it yet. He was concerned about side effects and costs and wondered if there were any alternatives. He and wife were asking questions regarding options for treatment. They asked specifically about pacemaker and ablation procedures.   He currently denies and dizziness, shortness of breath, presyncope, or syncopal episodes. Denies any sensation of palpitations or arrhythmias. States he sometimes feels a skipped beat or pause and his heart rate is sometimes slow. He denies any CVA or TIA like symptoms or current orthostatic symptoms. BP is mid 94/62. Not on AV nodal blockers. He states normally his BP is around 341 systolic. Denies any ortohpnea or PND. No LE edema or  DVT/PE symptoms. We discussed rhythm versus rate control strategies for management. We discussed he would need at least 3 weeks of uninterupted systemic anticoagulation before cardioversion could be done. Patient and wife both verbalize understanding.   Past Medical History:  Diagnosis Date  . GERD (gastroesophageal reflux disease)   . Hyperlipidemia   . Orthostatic lightheadedness     Past Surgical History:  Procedure Laterality Date  . TONSILLECTOMY      Current Outpatient Medications  Medication Sig Dispense Refill  . atorvastatin (LIPITOR) 20 MG tablet Take 10 mg by mouth daily.    Marland Kitchen omeprazole (PRILOSEC) 20 MG capsule Take 20 mg by mouth daily.  3  . ELIQUIS 5 MG TABS tablet Take 5 mg by mouth 2 (two) times daily. (Patient not taking: Reported on 06/08/2020)     No current facility-administered medications for this visit.   Allergies:  Patient has no known allergies.   Social History: The patient  reports that he has never smoked. He has never used smokeless tobacco. He reports that he does not drink alcohol and does not use drugs.   Family History: The patient's family history includes Asthma in his mother; Cancer in his father; Heart attack in his sister; Heart failure in his father; Hyperlipidemia in his sister.   ROS:  Please see the history of present illness. Otherwise, complete review of systems is positive for none.  All other systems are reviewed and negative.   Physical Exam: VS:  BP 94/62   Pulse 95   Ht 5\' 11"  (1.803 m)   Wt 196 lb  3.2 oz (89 kg)   SpO2 95%   BMI 27.36 kg/m , BMI Body mass index is 27.36 kg/m.  Wt Readings from Last 3 Encounters:  06/08/20 196 lb 3.2 oz (89 kg)  10/28/18 193 lb (87.5 kg)  06/05/15 213 lb (96.6 kg)    General: Patient appears comfortable at rest. Neck: Supple, no elevated JVP or carotid bruits, no thyromegaly. Lungs: Clear to auscultation, nonlabored breathing at rest. Cardiac: irregularly irregular rate and rhythm, no  S3 or significant systolic murmur, no pericardial rub. Extremities: No pitting edema, distal pulses 2+. Skin: Warm and dry. Musculoskeletal: No kyphosis. Neuropsychiatric: Alert and oriented x3, affect grossly appropriate.  ECG:  An ECG dated 06/08/2020 was personally reviewed today and demonstrated:  atrial fibrillation rate of 77.   Recent Labwork: No results found for requested labs within last 8760 hours.     Component Value Date/Time   CHOL 171 04/26/2014 1002   TRIG 127 04/26/2014 1002   HDL 42 04/26/2014 1002   CHOLHDL 4.1 04/26/2014 1002   VLDL 25 04/26/2014 1002   LDLCALC 104 (H) 04/26/2014 1002    Other Studies Reviewed Today:  MRI Brain / Head CT 06/06/2020 Impression  Mildly motion degraded examination.   No evidence of acute intracranial abnormality. Specifically, there  is no evidence of acute or recent subacute infarction.   Minimal cerebral white matter chronic small vessel ischemic disease,  slightly progressedfrom the brain MRI of 07/06/2008.   Stable mild cerebral atrophy.   Mild paranasal sinus disease, as described.    Electronically Signed  By: Kellie Simmering DO  On: 06/06/2020 15:39  Narrative  CLINICAL DATA: Transient ischemic attack (TIA). Additional history  provided: Saturday patient reports shortness of breath and dizziness  with heart rate fluctuating and out of rhythm. Patient reports  symptoms have improved since Saturday.   EXAM:  MRI HEAD WITHOUT CONTRAST   TECHNIQUE:  Multiplanar, multiecho pulse sequences of the brain and surrounding  structures were obtained without intravenous contrast.   COMPARISON: Brain MRI 07/06/2008.   FINDINGS:  Brain:   Mild intermittent motion degradation. Most notably, there is mild  motion degradation of the axial diffusion-weighted sequence.   Minimal scattered and ill-defined T2/FLAIR hyperintensity within the  bilateral cerebral white matter, nonspecific but compatible with   chronic small vessel ischemic disease.   There is no acute infarct.   No evidence of intracranial mass.   No chronic intracranial blood products.   No extra-axial fluid collection.   No midline shift.   Partially empty sella turcica.   Vascular: Expected proximalarterial flow voids.   Skull and upper cervical spine: No focal marrow lesion.   Sinuses/Orbits: Visualized orbits show no acute finding. Trace left  frontal and bilateral ethmoid sinus mucosal thickening. Small right  maxillary sinus mucous retentioncyst.  Procedure Note  Lavetta Nielsen, DO - 06/06/2020  Formatting of this note might be different from the original.  CLINICAL DATA: Transient ischemic attack (TIA). Additional history  provided: Saturday patient reports shortness of breath and dizziness  with heart rate fluctuating and out of rhythm. Patient reports  symptoms have improved since Saturday.   EXAM:  MRI HEAD WITHOUT CONTRAST   TECHNIQUE:  Multiplanar, multiecho pulse sequences of the brain and surrounding  structures were obtained without intravenous contrast.   COMPARISON: Brain MRI 07/06/2008.   FINDINGS:  Brain:   Mild intermittent motion degradation. Most notably, there is mild  motion degradation of the axial diffusion-weighted sequence.   Minimal scattered  and ill-defined T2/FLAIR hyperintensity within the  bilateral cerebral white matter, nonspecific but compatible with  chronic small vessel ischemic disease.   There is no acute infarct.   No evidence of intracranial mass.   No chronic intracranial blood products.   No extra-axial fluid collection.   No midline shift.   Partially empty sella turcica.   Vascular: Expected proximal arterial flow voids.   Skull and upper cervical spine: No focal marrow lesion.   Sinuses/Orbits: Visualized orbits show no acute finding. Trace left  frontal and bilateral ethmoid sinus mucosal thickening. Small right  maxillary sinus  mucous retention cyst.   IMPRESSION:  Mildly motion degraded examination.   No evidence of acute intracranial abnormality. Specifically, there  is no evidence of acuteor recent subacute infarction.   Minimal cerebral white matter chronic small vessel ischemic disease,  slightly progressed from the brain MRI of 07/06/2008.   Stable mild cerebral atrophy.   Mild paranasal sinus disease, as described.     . 09/2008 GXT CLINICAL DATA: A 70 year old gentleman with a history of dizziness and syncope, who had no increase in heart rate on a 6-minute walk test performed recently. 1. Treadmill exercise performed to a workload of 12.5 METS and a heart  rate of 164, 101% of age predicted maximum. Exercise discontinued  due to dyspnea and fatigue; no chest discomfort reported. 2. Blood pressure increased from a resting value of 150/80-170/80 at  peak exercise, a normal response. 3. No arrhythmias noted. 4. EKG: Sinus bradycardia; within normal limits.  STRESS EKG: Insignificant upsloping ST-segment depression resolving quickly in recovery.  IMPRESSION: Negative and adequate graded exercise test.  Assessment and Plan:  1. Atrial fibrillation, unspecified type ([place])   2. Near syncope   3. SOB (shortness of breath)   4. Mixed hyperlipidemia   5. History of bradycardia    1. Atrial fibrillation, unspecified type ([place]) New onset atrial fibrillation discovered at PCP office after syncope / near syncopal episode, suspected TIA. EKG from PCP office dated 06/06/2020 with rate controlled atrial fibrillation date 06/06/2020 confirmed by EKG in our office today. Patient was ordered Eliquis by PCP but has not started yet. We discussed options for treatment. Patient is willing to undergo DCCV after 3 weeks of uninterrupted anticoagulation. He was concerned over the cost of medication and potential side effects. We discussed DOACs versus Coumadin. Patient will proceed with  Eliquis. Start Eliqus 5 mg po bid and we will follow up in 3 weeks. I will send Dr Domenic Polite today's note for advice on scheduling DCCV.   2. Near syncope Recent history of near syncope with suspected TIA. PCP ordered MRI. No evidence of CVA or TIA. See report above. No recurrent of near syncope  3. SOB (shortness of breath) Recent SOB associated with dizziness. No current SOB or DOE. Please get an echocardiogram.   4. Mixed hyperlipidemia Continue atorvastatin 20 mg po daily.  5. History of bradycardia Patient with recent hx of dizziness, SOB, near syncope and new onset atrial fibrillation. Recent reported skipped heart beats / pauses with associated dizziness. Please get 14 day Zio monitor to assess atrial fibrillation burden as well as complaint of pauses /skipped beats with dizziness.     Medication Adjustments/Labs and Tests Ordered: Current medicines are reviewed at length with the patient today.  Concerns regarding medicines are outlined above.   Disposition: Follow-up with Dr Domenic Polite or APP 3 weeks.  Signed, Levell July, NP 06/10/2020 5:44 PM    [facility]  [facility], [place], [place] [address] Phone: [phone]; Fax: [phone]

## 2020-06-10 ENCOUNTER — Encounter: Payer: Self-pay | Admitting: Family Medicine

## 2020-06-13 ENCOUNTER — Telehealth: Payer: Self-pay | Admitting: Family Medicine

## 2020-06-13 ENCOUNTER — Encounter: Payer: Self-pay | Admitting: Family Medicine

## 2020-06-13 DIAGNOSIS — I495 Sick sinus syndrome: Secondary | ICD-10-CM | POA: Diagnosis not present

## 2020-06-13 DIAGNOSIS — I4891 Unspecified atrial fibrillation: Secondary | ICD-10-CM | POA: Diagnosis not present

## 2020-06-13 DIAGNOSIS — R42 Dizziness and giddiness: Secondary | ICD-10-CM

## 2020-06-13 DIAGNOSIS — R55 Syncope and collapse: Secondary | ICD-10-CM | POA: Diagnosis not present

## 2020-06-13 NOTE — Telephone Encounter (Signed)
Pre-cert Verification for the following procedure    ECHO   DATE:  06/19/2020  LOCATION: [facility]

## 2020-06-14 NOTE — Progress Notes (Signed)
Keith Luna. Please make sure the patient gets the monitor placed and has echocardiogram performed. Make sure the patient knows he needs to take the Eliquis uninterrupted. Dr Domenic Polite would like these done pending scheduleing cardioversion. Thank You

## 2020-06-14 NOTE — Progress Notes (Signed)
I added both studies to my note and re-sent it back to Dr Domenic Polite for review. I wanted him to review for instructions regarding possible cardioversion.  Thanks Alma Friendly !

## 2020-06-15 ENCOUNTER — Ambulatory Visit: Payer: Medicare Other | Admitting: Family Medicine

## 2020-06-16 NOTE — Progress Notes (Signed)
Echo has been scheduled for 06/19/2020 & per Zio site - monitor has been delivered to patient home.

## 2020-06-19 ENCOUNTER — Ambulatory Visit (INDEPENDENT_AMBULATORY_CARE_PROVIDER_SITE_OTHER): Payer: Medicare Other

## 2020-06-19 DIAGNOSIS — R0602 Shortness of breath: Secondary | ICD-10-CM

## 2020-06-20 LAB — ECHOCARDIOGRAM COMPLETE
AR max vel: 2.01 cm2
AV Area VTI: 2.18 cm2
AV Area mean vel: 2 cm2
AV Mean grad: 3.8 mmHg
AV Peak grad: 7.5 mmHg
AV Vena cont: 0.36 cm
Ao pk vel: 1.37 m/s
Area-P 1/2: 2.52 cm2
Calc EF: 61.9 %
MV M vel: 4.62 m/s
MV Peak grad: 85.3 mmHg
P 1/2 time: 938 msec
S' Lateral: 3.32 cm
Single Plane A2C EF: 63.4 %
Single Plane A4C EF: 58.1 %

## 2020-06-21 ENCOUNTER — Telehealth: Payer: Self-pay | Admitting: *Deleted

## 2020-06-21 NOTE — Telephone Encounter (Signed)
-----   Message from Verta Ellen., NP sent at 06/21/2020  5:24 PM EDT ----- Please call the patient and let him know the echocardiogram shows he has good pumping function of the heart. Has two very slightly leaking valves. This is not uncommon as we age. This is not a clinically significant finding.

## 2020-06-21 NOTE — Telephone Encounter (Signed)
Laurine Blazer, LPN  4/58/0998 3:38 PM EDT Back to Top     Notified, copy to pcp.

## 2020-06-29 ENCOUNTER — Other Ambulatory Visit (HOSPITAL_COMMUNITY): Payer: Medicare Other

## 2020-06-30 ENCOUNTER — Telehealth: Payer: Self-pay | Admitting: *Deleted

## 2020-06-30 ENCOUNTER — Other Ambulatory Visit: Payer: Self-pay | Admitting: Family Medicine

## 2020-06-30 DIAGNOSIS — I495 Sick sinus syndrome: Secondary | ICD-10-CM | POA: Diagnosis not present

## 2020-06-30 DIAGNOSIS — R42 Dizziness and giddiness: Secondary | ICD-10-CM | POA: Diagnosis not present

## 2020-06-30 DIAGNOSIS — R55 Syncope and collapse: Secondary | ICD-10-CM | POA: Diagnosis not present

## 2020-06-30 DIAGNOSIS — I4891 Unspecified atrial fibrillation: Secondary | ICD-10-CM

## 2020-06-30 NOTE — Telephone Encounter (Signed)
4/29 is next available to schedule cardioversion - will forward if ok to schedule on this date

## 2020-06-30 NOTE — Telephone Encounter (Signed)
Pt aware and agreeable to cardioversion - denies any missed doses of Eliquis - routed monitor results to provider - will schedule cardioversion and pre op appt for labs

## 2020-06-30 NOTE — Telephone Encounter (Signed)
Okay thanks.  Dr. Domenic Polite wanted these done prior to scheduling him for her cardioversion.  It looks like he is in atrial fibrillation 100% of the time.  Can you forward the note also to Dr. Domenic Polite to make sure he is aware so he can make the decision on when to schedule the cardioversion.?  Thanks

## 2020-06-30 NOTE — Telephone Encounter (Signed)
LM w/short stay scheduling

## 2020-06-30 NOTE — Telephone Encounter (Signed)
Monitor needs to be sent to a provider for a formal read.  Recent echocardiogram shows normal LVEF and mildly dilated left atrium.  It does not look like he is on any AV nodal blockers for heart rate control, would hold off for now and plan on cardioversion given episodes of bradycardia and overall average heart rate in the 70s at baseline.  Verify compliance with Eliquis, he is also going to need repeat lab work prior to the procedure.  Please realize that when scheduling this, it cannot be done on a day when there is locums cardiology coverage in the hospital at [facility] (they do not do these procedures.  Would aim for the next 1 to 2 weeks, with either myself or Dr. Harl Bowie depending on availability.

## 2020-06-30 NOTE — Telephone Encounter (Signed)
That would make 2 scheduled on the same day which is fine as long as this can be accommodated by the anesthesia team at [facility]  If not, would look at the next week.

## 2020-06-30 NOTE — Telephone Encounter (Signed)
[employer] rep contacted [place] regarding pt completed monitor resutls: Continuous afib HR 36-219  1 episode of Vtach average HR 113 - results forwarded to Katina Dung, NP

## 2020-07-03 ENCOUNTER — Other Ambulatory Visit: Payer: Self-pay | Admitting: Cardiology

## 2020-07-03 NOTE — Telephone Encounter (Signed)
Cardioversion scheduled for 4/29 @ 11:15am (spoke with Hoyle Sauer) pre op appt 4/27 @ 8:30am - pt made aware of appt times and aware that will have covid test and labs done at pre op

## 2020-07-03 NOTE — Telephone Encounter (Signed)
Orders completed

## 2020-07-04 NOTE — Patient Instructions (Signed)
Keith Mano Liebler Sr.  07/04/2020     @PREFPERIOPPHARMACY @   Your procedure is scheduled on  07/07/2020.   Report to [facility] at  [place].M.   Call this number if you have problems the morning of surgery:  [phone]   Remember:  Do not eat or drink after midnight.                       Take these medicines the morning of surgery with A SIP OF WATER  Prilosec.   DO NOT miss any doses of your eliquis.     Please brush your teeth.  Do not wear jewelry, make-up or nail polish.  Do not wear lotions, powders, or perfumes, or deodorant.  Do not shave 48 hours prior to surgery.  Men may shave face and neck.  Do not bring valuables to the hospital.  [facility] is not responsible for any belongings or valuables.  Contacts, dentures or bridgework may not be worn into surgery.  Leave your suitcase in the car.  After surgery it may be brought to your room.  For patients admitted to the hospital, discharge time will be determined by your treatment team.  Patients discharged the day of surgery will not be allowed to drive home and must have someone with them for 24 hours.    Special instructions:  DO NOT smoke tobacco or vape for 24 hours before your procedure.  Please read over the following fact sheets that you were given. Anesthesia Post-op Instructions and Care and Recovery After Surgery       Electrical Cardioversion Electrical cardioversion is the delivery of a jolt of electricity to restore a normal rhythm to the heart. A rhythm that is too fast or is not regular keeps the heart from pumping well. In this procedure, sticky patches or metal paddles are placed on the chest to deliver electricity to the heart from a device. This procedure may be done in an emergency if:  There is low or no blood pressure as a result of the heart rhythm.  Normal rhythm must be restored as fast as possible to protect the brain and heart from further damage.  It may save a  life. This may also be a scheduled procedure for irregular or fast heart rhythms that are not immediately life-threatening. Tell a health care provider about:  Any allergies you have.  All medicines you are taking, including vitamins, herbs, eye drops, creams, and over-the-counter medicines.  Any problems you or family members have had with anesthetic medicines.  Any blood disorders you have.  Any surgeries you have had.  Any medical conditions you have.  Whether you are pregnant or may be pregnant. What are the risks? Generally, this is a safe procedure. However, problems may occur, including:  Allergic reactions to medicines.  A blood clot that breaks free and travels to other parts of your body.  The possible return of an abnormal heart rhythm within hours or days after the procedure.  Your heart stopping (cardiac arrest). This is rare. What happens before the procedure? Medicines  Your health care provider may have you start taking: ? Blood-thinning medicines (anticoagulants) so your blood does not clot as easily. ? Medicines to help stabilize your heart rate and rhythm.  Ask your health care provider about: ? Changing or stopping your regular medicines. This is especially important if you are taking diabetes medicines or blood thinners. ?  Taking medicines such as aspirin and ibuprofen. These medicines can thin your blood. Do not take these medicines unless your health care provider tells you to take them. ? Taking over-the-counter medicines, vitamins, herbs, and supplements. General instructions  Follow instructions from your health care provider about eating or drinking restrictions.  Plan to have someone take you home from the hospital or clinic.  If you will be going home right after the procedure, plan to have someone with you for 24 hours.  Ask your health care provider what steps will be taken to help prevent infection. These may include washing your skin with  a germ-killing soap. What happens during the procedure?  An IV will be inserted into one of your veins.  Sticky patches (electrodes) or metal paddles may be placed on your chest.  You will be given a medicine to help you relax (sedative).  An electrical shock will be delivered. The procedure may vary among health care providers and hospitals.   What can I expect after the procedure?  Your blood pressure, heart rate, breathing rate, and blood oxygen level will be monitored until you leave the hospital or clinic.  Your heart rhythm will be watched to make sure it does not change.  You may have some redness on the skin where the shocks were given. Follow these instructions at home:  Do not drive for 24 hours if you were given a sedative during your procedure.  Take over-the-counter and prescription medicines only as told by your health care provider.  Ask your health care provider how to check your pulse. Check it often.  Rest for 48 hours after the procedure or as told by your health care provider.  Avoid or limit your caffeine use as told by your health care provider.  Keep all follow-up visits as told by your health care provider. This is important. Contact a health care provider if:  You feel like your heart is beating too quickly or your pulse is not regular.  You have a serious muscle cramp that does not go away. Get help right away if:  You have discomfort in your chest.  You are dizzy or you feel faint.  You have trouble breathing or you are short of breath.  Your speech is slurred.  You have trouble moving an arm or leg on one side of your body.  Your fingers or toes turn cold or blue. Summary  Electrical cardioversion is the delivery of a jolt of electricity to restore a normal rhythm to the heart.  This procedure may be done right away in an emergency or may be a scheduled procedure if the condition is not an emergency.  Generally, this is a safe  procedure.  After the procedure, check your pulse often as told by your health care provider. This information is not intended to replace advice given to you by your health care provider. Make sure you discuss any questions you have with your health care provider. Document Revised: 09/28/2018 Document Reviewed: 09/28/2018 Elsevier Patient Education  2021 [place] After This sheet gives you information about how to care for yourself after your procedure. Your health care provider may also give you more specific instructions. If you have problems or questions, contact your health care provider. What can I expect after the procedure? After the procedure, it is common to have:  Tiredness.  Forgetfulness about what happened after the procedure.  Impaired judgment for important decisions.  Nausea or vomiting.  Some difficulty with balance. Follow these instructions at home: For the time period you were told by your health care provider:  Rest as needed.  Do not participate in activities where you could fall or become injured.  Do not drive or use machinery.  Do not drink alcohol.  Do not take sleeping pills or medicines that cause drowsiness.  Do not make important decisions or sign legal documents.  Do not take care of children on your own.      Eating and drinking  Follow the diet that is recommended by your health care provider.  Drink enough fluid to keep your urine pale yellow.  If you vomit: ? Drink water, juice, or soup when you can drink without vomiting. ? Make sure you have little or no nausea before eating solid foods. General instructions  Have a responsible adult stay with you for the time you are told. It is important to have someone help care for you until you are awake and alert.  Take over-the-counter and prescription medicines only as told by your health care provider.  If you have sleep apnea, surgery and certain  medicines can increase your risk for breathing problems. Follow instructions from your health care provider about wearing your sleep device: ? Anytime you are sleeping, including during daytime naps. ? While taking prescription pain medicines, sleeping medicines, or medicines that make you drowsy.  Avoid smoking.  Keep all follow-up visits as told by your health care provider. This is important. Contact a health care provider if:  You keep feeling nauseous or you keep vomiting.  You feel light-headed.  You are still sleepy or having trouble with balance after 24 hours.  You develop a rash.  You have a fever.  You have redness or swelling around the IV site. Get help right away if:  You have trouble breathing.  You have new-onset confusion at home. Summary  For several hours after your procedure, you may feel tired. You may also be forgetful and have poor judgment.  Have a responsible adult stay with you for the time you are told. It is important to have someone help care for you until you are awake and alert.  Rest as told. Do not drive or operate machinery. Do not drink alcohol or take sleeping pills.  Get help right away if you have trouble breathing, or if you suddenly become confused. This information is not intended to replace advice given to you by your health care provider. Make sure you discuss any questions you have with your health care provider. Document Revised: 11/11/2019 Document Reviewed: 01/28/2019 Elsevier Patient Education  2021 [employer].

## 2020-07-05 ENCOUNTER — Encounter (HOSPITAL_COMMUNITY): Payer: Self-pay

## 2020-07-05 ENCOUNTER — Other Ambulatory Visit: Payer: Self-pay

## 2020-07-05 ENCOUNTER — Other Ambulatory Visit (HOSPITAL_COMMUNITY)
Admission: RE | Admit: 2020-07-05 | Discharge: 2020-07-05 | Disposition: A | Discharge status: Home / Self Care | Payer: Medicare Other | Source: Ambulatory Visit | Attending: Cardiology | Admitting: Cardiology

## 2020-07-05 ENCOUNTER — Encounter (HOSPITAL_COMMUNITY)
Admission: RE | Admit: 2020-07-05 | Discharge: 2020-07-05 | Disposition: A | Discharge status: Home / Self Care | Payer: Medicare Other | Source: Ambulatory Visit | Attending: Cardiology | Admitting: Cardiology

## 2020-07-05 DIAGNOSIS — Z01812 Encounter for preprocedural laboratory examination: Secondary | ICD-10-CM | POA: Diagnosis not present

## 2020-07-05 DIAGNOSIS — Z20822 Contact with and (suspected) exposure to covid-19: Secondary | ICD-10-CM | POA: Diagnosis not present

## 2020-07-05 DIAGNOSIS — I6523 Occlusion and stenosis of bilateral carotid arteries: Secondary | ICD-10-CM | POA: Diagnosis not present

## 2020-07-05 DIAGNOSIS — I4891 Unspecified atrial fibrillation: Secondary | ICD-10-CM | POA: Diagnosis not present

## 2020-07-05 DIAGNOSIS — I491 Atrial premature depolarization: Secondary | ICD-10-CM | POA: Diagnosis not present

## 2020-07-05 LAB — BASIC METABOLIC PANEL
Anion gap: 9 (ref 5–15)
BUN: 21 mg/dL (ref 8–23)
CO2: 23 mmol/L (ref 22–32)
Calcium: 8.6 mg/dL — ABNORMAL LOW (ref 8.9–10.3)
Chloride: 103 mmol/L (ref 98–111)
Creatinine, Ser: 1.2 mg/dL (ref 0.61–1.24)
GFR, Estimated: >60 mL/min (ref >60)
Glucose, Bld: 96 mg/dL (ref 70–99)
Potassium: 3.9 mmol/L (ref 3.5–5.1)
Sodium: 135 mmol/L (ref 135–145)

## 2020-07-05 LAB — SARS CORONAVIRUS 2 (TAT 6-24 HRS): SARS Coronavirus 2: NEGATIVE

## 2020-07-07 ENCOUNTER — Ambulatory Visit (HOSPITAL_COMMUNITY): Payer: Medicare Other | Admitting: Anesthesiology

## 2020-07-07 ENCOUNTER — Ambulatory Visit (HOSPITAL_COMMUNITY)
Admission: RE | Admit: 2020-07-07 | Discharge: 2020-07-07 | Disposition: A | Discharge status: Home / Self Care | Payer: Medicare Other | Attending: Cardiology | Admitting: Cardiology

## 2020-07-07 ENCOUNTER — Other Ambulatory Visit: Payer: Self-pay

## 2020-07-07 ENCOUNTER — Ambulatory Visit: Payer: Medicare Other | Admitting: Family Medicine

## 2020-07-07 ENCOUNTER — Encounter (HOSPITAL_COMMUNITY): Payer: Self-pay | Admitting: Cardiology

## 2020-07-07 ENCOUNTER — Encounter (HOSPITAL_COMMUNITY)
Admission: RE | Disposition: A | Discharge status: Home / Self Care | Payer: Self-pay | Source: Home / Self Care | Attending: Cardiology

## 2020-07-07 DIAGNOSIS — G459 Transient cerebral ischemic attack, unspecified: Secondary | ICD-10-CM | POA: Diagnosis not present

## 2020-07-07 DIAGNOSIS — I4819 Other persistent atrial fibrillation: Secondary | ICD-10-CM | POA: Diagnosis not present

## 2020-07-07 DIAGNOSIS — E782 Mixed hyperlipidemia: Secondary | ICD-10-CM | POA: Diagnosis not present

## 2020-07-07 DIAGNOSIS — K219 Gastro-esophageal reflux disease without esophagitis: Secondary | ICD-10-CM | POA: Insufficient documentation

## 2020-07-07 DIAGNOSIS — I4891 Unspecified atrial fibrillation: Secondary | ICD-10-CM | POA: Diagnosis not present

## 2020-07-07 DIAGNOSIS — Z8249 Family history of ischemic heart disease and other diseases of the circulatory system: Secondary | ICD-10-CM | POA: Insufficient documentation

## 2020-07-07 DIAGNOSIS — Z79899 Other long term (current) drug therapy: Secondary | ICD-10-CM | POA: Diagnosis not present

## 2020-07-07 DIAGNOSIS — I6782 Cerebral ischemia: Secondary | ICD-10-CM | POA: Insufficient documentation

## 2020-07-07 HISTORY — PX: CARDIOVERSION: SHX1299

## 2020-07-07 SURGERY — CARDIOVERSION
Anesthesia: General

## 2020-07-07 MED ORDER — LACTATED RINGERS IV SOLN: INTRAVENOUS | Status: DC | PRN

## 2020-07-07 MED ORDER — PROPOFOL 10 MG/ML IV BOLUS
INTRAVENOUS | Status: AC
  Filled 2020-07-07: qty 20

## 2020-07-07 MED ORDER — PROPOFOL 10 MG/ML IV BOLUS
INTRAVENOUS | Status: DC | PRN
  Administered 2020-07-07: 80 mg via INTRAVENOUS

## 2020-07-07 MED ORDER — LIDOCAINE HCL (PF) 2 % IJ SOLN
INTRAMUSCULAR | Status: AC
  Filled 2020-07-07: qty 5

## 2020-07-07 MED ORDER — LIDOCAINE 2% (20 MG/ML) 5 ML SYRINGE
INTRAMUSCULAR | Status: DC | PRN
  Administered 2020-07-07: 60 mg via INTRAVENOUS

## 2020-07-07 NOTE — Progress Notes (Signed)
Electrical Cardioversion Procedure Note ESHAN TRUPIANO Sr. [id number] 02/23/1951  Procedure: Electrical Cardioversion Indications:  Persistent Atrial Fibrillation  Procedure Details Consent: Elective direct current cardioversion with Dr Domenic Polite. Time Out: Verified patient identification, verified procedure, site/side was marked, verified correct patient position, special equipment/implants available, medications/allergies/relevent history reviewed, required imaging and test results available.Time out performed 1119. Patient placed on cardiac monitor, pulse oximetry, supplemental oxygen as necessary.  Sedation given:per CRNA. Pacer pads placed 0037 Cardioverted1time(s).  Cardioverted at 120 joules. Evaluation Findings: Post procedure EKG shows: Sinus Bradycardia. Complications: None. Patient did  tolerate procedure well.   Jon Gills Kalaya Infantino 07/07/2020, 11:44 AM

## 2020-07-07 NOTE — Transfer of Care (Signed)
Immediate Anesthesia Transfer of Care Note  Patient: Keith Detter Rada Sr.  Procedure(s) Performed: CARDIOVERSION (N/A )  Patient Location: PACU  Anesthesia Type:General  Level of Consciousness: awake  Airway & Oxygen Therapy: Patient Spontanous Breathing and Patient connected to nasal cannula oxygen  Post-op Assessment: Report given to RN, Post -op Vital signs reviewed and stable, Patient moving all extremities and Patient moving all extremities X 4  Post vital signs: Reviewed and stable  Last Vitals:  Vitals Value Taken Time  BP    Temp    Pulse    Resp    SpO2      Last Pain:  Vitals:   07/07/20 1042  TempSrc: Oral  PainSc: 0-No pain         Complications: No complications documented.

## 2020-07-07 NOTE — Anesthesia Procedure Notes (Signed)
Date/Time: 07/07/2020 11:07 AM Performed by: Orlie Dakin, CRNA Pre-anesthesia Checklist: Patient identified, Emergency Drugs available, Suction available and Patient being monitored Patient Re-evaluated:Patient Re-evaluated prior to induction Oxygen Delivery Method: Nasal cannula Induction Type: IV induction Placement Confirmation: positive ETCO2

## 2020-07-07 NOTE — Anesthesia Preprocedure Evaluation (Addendum)
Anesthesia Evaluation  Patient identified by MRN, date of birth, ID band Patient awake    Reviewed: Allergy & Precautions, NPO status , Patient's Chart, lab work & pertinent test results  History of Anesthesia Complications Negative for: history of anesthetic complications  Airway Mallampati: III  TM Distance: <3 FB Neck ROM: Full    Dental  (+) Dental Advisory Given, Caps   Pulmonary neg pulmonary ROS,    Pulmonary exam normal breath sounds clear to auscultation       Cardiovascular Exercise Tolerance: Good + dysrhythmias Atrial Fibrillation  Rhythm:Irregular Rate:Tachycardia  Please call the patient and let him know the echocardiogram shows he has good pumping function of the heart. Has two very slightly leaking valves. This is not uncommon as we age. This is not a clinically significant finding.   Neuro/Psych negative neurological ROS  negative psych ROS   GI/Hepatic Neg liver ROS, GERD  Medicated,  Endo/Other  negative endocrine ROS  Renal/GU negative Renal ROS     Musculoskeletal negative musculoskeletal ROS (+)   Abdominal   Peds  Hematology negative hematology ROS (+)   Anesthesia Other Findings   Reproductive/Obstetrics negative OB ROS                           Anesthesia Physical Anesthesia Plan  ASA: III  Anesthesia Plan: General   Post-op Pain Management:    Induction:   PONV Risk Score and Plan: Propofol infusion  Airway Management Planned: Natural Airway and Nasal Cannula  Additional Equipment:   Intra-op Plan:   Post-operative Plan:   Informed Consent: I have reviewed the patients History and Physical, chart, labs and discussed the procedure including the risks, benefits and alternatives for the proposed anesthesia with the patient or authorized representative who has indicated his/her understanding and acceptance.     Dental advisory given  Plan Discussed  with: CRNA and Surgeon  Anesthesia Plan Comments:        Anesthesia Quick Evaluation

## 2020-07-07 NOTE — CV Procedure (Signed)
Elective direct-current cardioversion  Indication: Persistent atrial fibrillation.  Description of procedure: After informed consent was obtained the patient was taken to the PACU where a timeout was performed.  Anterior and posterior pads were placed in standard fashion and connected to a biphasic defibrillator.  Deep sedation was achieved via use of propofol per the Anesthesia service, please refer to their records for full discussion of dose administration and monitoring.  Once adequate sedation was achieved, a sandbag was placed on the anterior chest pad.  A single, synchronized 120 J shock was then delivered with successful restoration of sinus bradycardia.  Patient remained hemodynamically stable throughout.  There were no immediate complications.  Satira Sark, M.D., F.A.C.C.

## 2020-07-07 NOTE — Anesthesia Postprocedure Evaluation (Signed)
Anesthesia Post Note  Patient: Keith Luna.  Procedure(s) Performed: CARDIOVERSION (N/A )  Patient location during evaluation: PACU Anesthesia Type: General Level of consciousness: awake and alert and oriented Pain management: pain level controlled Vital Signs Assessment: post-procedure vital signs reviewed and stable Respiratory status: spontaneous breathing and respiratory function stable Cardiovascular status: blood pressure returned to baseline and stable Postop Assessment: no apparent nausea or vomiting Anesthetic complications: no   No complications documented.   Last Vitals:  Vitals:   07/07/20 1042 07/07/20 1130  BP: 114/77 113/67  Pulse: (!) 56 (!) 43  Resp: 20 18  Temp: 36.5 C   SpO2: 100% 96%    Last Pain:  Vitals:   07/07/20 1130  TempSrc:   PainSc: 0-No pain                 Luretha Eberly C Jacorion Klem

## 2020-07-07 NOTE — Discharge Instructions (Signed)
Electrical Cardioversion Electrical cardioversion is the delivery of a jolt of electricity to restore a normal rhythm to the heart. A rhythm that is too fast or is not regular keeps the heart from pumping well. In this procedure, sticky patches or metal paddles are placed on the chest to deliver electricity to the heart from a device. This procedure may be done in an emergency if:  There is low or no blood pressure as a result of the heart rhythm.  Normal rhythm must be restored as fast as possible to protect the brain and heart from further damage.  It may save a life. This may also be a scheduled procedure for irregular or fast heart rhythms that are not immediately life-threatening. Tell a health care provider about:  Any allergies you have.  All medicines you are taking, including vitamins, herbs, eye drops, creams, and over-the-counter medicines.  Any problems you or family members have had with anesthetic medicines.  Any blood disorders you have.  Any surgeries you have had.  Any medical conditions you have.  Whether you are pregnant or may be pregnant. What are the risks? Generally, this is a safe procedure. However, problems may occur, including:  Allergic reactions to medicines.  A blood clot that breaks free and travels to other parts of your body.  The possible return of an abnormal heart rhythm within hours or days after the procedure.  Your heart stopping (cardiac arrest). This is rare. What happens before the procedure? Medicines  Your health care provider may have you start taking: ? Blood-thinning medicines (anticoagulants) so your blood does not clot as easily. ? Medicines to help stabilize your heart rate and rhythm.  Ask your health care provider about: ? Changing or stopping your regular medicines. This is especially important if you are taking diabetes medicines or blood thinners. ? Taking medicines such as aspirin and ibuprofen. These medicines can  thin your blood. Do not take these medicines unless your health care provider tells you to take them. ? Taking over-the-counter medicines, vitamins, herbs, and supplements. General instructions  Follow instructions from your health care provider about eating or drinking restrictions.  Plan to have someone take you home from the hospital or clinic.  If you will be going home right after the procedure, plan to have someone with you for 24 hours.  Ask your health care provider what steps will be taken to help prevent infection. These may include washing your skin with a germ-killing soap. What happens during the procedure?  An IV will be inserted into one of your veins.  Sticky patches (electrodes) or metal paddles may be placed on your chest.  You will be given a medicine to help you relax (sedative).  An electrical shock will be delivered. The procedure may vary among health care providers and hospitals.   What can I expect after the procedure?  Your blood pressure, heart rate, breathing rate, and blood oxygen level will be monitored until you leave the hospital or clinic.  Your heart rhythm will be watched to make sure it does not change.  You may have some redness on the skin where the shocks were given. Follow these instructions at home:  Do not drive for 24 hours if you were given a sedative during your procedure.  Take over-the-counter and prescription medicines only as told by your health care provider.  Ask your health care provider how to check your pulse. Check it often.  Rest for 48 hours after the procedure   or as told by your health care provider.  Avoid or limit your caffeine use as told by your health care provider.  Keep all follow-up visits as told by your health care provider. This is important. Contact a health care provider if:  You feel like your heart is beating too quickly or your pulse is not regular.  You have a serious muscle cramp that does not go  away. Get help right away if:  You have discomfort in your chest.  You are dizzy or you feel faint.  You have trouble breathing or you are short of breath.  Your speech is slurred.  You have trouble moving an arm or leg on one side of your body.  Your fingers or toes turn cold or blue. Summary  Electrical cardioversion is the delivery of a jolt of electricity to restore a normal rhythm to the heart.  This procedure may be done right away in an emergency or may be a scheduled procedure if the condition is not an emergency.  Generally, this is a safe procedure.  After the procedure, check your pulse often as told by your health care provider. This information is not intended to replace advice given to you by your health care provider. Make sure you discuss any questions you have with your health care provider. Document Revised: 09/28/2018 Document Reviewed: 09/28/2018 Elsevier Patient Education  2021 [employer] Inc.  

## 2020-07-07 NOTE — Interval H&P Note (Signed)
History and Physical Interval Note:  07/07/2020 11:45 AM  Keith Curling Harten Sr.  has presented today for surgery, with the diagnosis of a-fib.  The various methods of treatment have been discussed with the patient and family. After consideration of risks, benefits and other options for treatment, the patient has consented to  Procedure(s): CARDIOVERSION (N/A) as a surgical intervention.  The patient's history has been reviewed, patient examined, no change in status, stable for surgery.  I have reviewed the patient's chart and labs.  Questions were answered to the patient's satisfaction.     Rozann Lesches

## 2020-07-10 ENCOUNTER — Encounter (HOSPITAL_COMMUNITY): Payer: Self-pay | Admitting: Cardiology

## 2020-07-13 ENCOUNTER — Telehealth: Payer: Self-pay | Admitting: Cardiology

## 2020-07-13 ENCOUNTER — Ambulatory Visit: Payer: Medicare Other

## 2020-07-13 NOTE — Telephone Encounter (Signed)
Please have him stop by the office for an ECG.  If he is in fact back in atrial fibrillation, would suggest having him seen in the atrial fibrillation clinic for discussion of antiarrhythmic therapy and other treatment options.

## 2020-07-13 NOTE — Telephone Encounter (Signed)
Patient notified and verbalized understanding.   Nurse visit scheduled for Friday, 07/14/2020 at 9:00 am in [place] office.

## 2020-07-13 NOTE — Telephone Encounter (Signed)
Had DCCV on 07/07/2020 - feels like he has been out of rhythm since 07/10/2020.  States that he does feel a little more fatigued & winded more than usual.  HR running 60-100's.  Does go higher with activity.  No chest pain.  Does have OV scheduled Friday, 07/21/2020 with Jonni Sanger.

## 2020-07-13 NOTE — Telephone Encounter (Signed)
Patient called back to let Keith Luna know that since his surgery, he feel he is out of rhythm since Tuesday. Patient would like to know what to do. He has f/u scheduled 5/13 with Leonides Sake. He can be reached at [phone]

## 2020-07-14 ENCOUNTER — Other Ambulatory Visit: Payer: Self-pay

## 2020-07-14 ENCOUNTER — Ambulatory Visit (INDEPENDENT_AMBULATORY_CARE_PROVIDER_SITE_OTHER): Payer: Medicare Other

## 2020-07-14 VITALS — BP 122/80 | HR 66 | Ht 71.0 in

## 2020-07-14 DIAGNOSIS — I499 Cardiac arrhythmia, unspecified: Secondary | ICD-10-CM | POA: Diagnosis not present

## 2020-07-14 DIAGNOSIS — I4891 Unspecified atrial fibrillation: Secondary | ICD-10-CM

## 2020-07-14 NOTE — Patient Instructions (Signed)
I have place a referral to A-fib clinic in [place]. They will call you to schedule an appointment.    I will call you if Dr.McDowell has any other instructions for you.       Thank you for choosing [facility] !

## 2020-07-14 NOTE — Progress Notes (Signed)
Patient seen in [place] office today for EKG.He reports feeling well for approximately 4 days after cardioversion on 07/07/20. He feels he has been out of rhythm since . EKG obtained today reveal atrial fibrillation. Per Dr.Mcdowell, referral placed to A-fib clinic. Patient is agreeable to plan. I will FYI Dr.McDowell      Satira Sark, MD to Laurine Blazer, LPN     05/16/08 1:75 PM Note Please have him stop by the office for an ECG.  If he is in fact back in atrial fibrillation, would suggest having him seen in the atrial fibrillation clinic for discussion of antiarrhythmic therapy and other treatment options.

## 2020-07-19 ENCOUNTER — Encounter (HOSPITAL_COMMUNITY): Payer: Self-pay | Admitting: Nurse Practitioner

## 2020-07-19 ENCOUNTER — Ambulatory Visit (HOSPITAL_COMMUNITY)
Admission: RE | Admit: 2020-07-19 | Discharge: 2020-07-19 | Disposition: A | Discharge status: Home / Self Care | Payer: Medicare Other | Source: Ambulatory Visit | Attending: Nurse Practitioner | Admitting: Nurse Practitioner

## 2020-07-19 ENCOUNTER — Other Ambulatory Visit: Payer: Self-pay

## 2020-07-19 VITALS — BP 124/92 | HR 65 | Ht 71.0 in | Wt 197.2 lb

## 2020-07-19 DIAGNOSIS — I4819 Other persistent atrial fibrillation: Secondary | ICD-10-CM | POA: Diagnosis not present

## 2020-07-19 DIAGNOSIS — K219 Gastro-esophageal reflux disease without esophagitis: Secondary | ICD-10-CM | POA: Diagnosis not present

## 2020-07-19 DIAGNOSIS — Z7901 Long term (current) use of anticoagulants: Secondary | ICD-10-CM | POA: Insufficient documentation

## 2020-07-19 DIAGNOSIS — D6869 Other thrombophilia: Secondary | ICD-10-CM | POA: Diagnosis not present

## 2020-07-19 DIAGNOSIS — E785 Hyperlipidemia, unspecified: Secondary | ICD-10-CM | POA: Insufficient documentation

## 2020-07-19 DIAGNOSIS — Z79899 Other long term (current) drug therapy: Secondary | ICD-10-CM | POA: Insufficient documentation

## 2020-07-19 DIAGNOSIS — R001 Bradycardia, unspecified: Secondary | ICD-10-CM | POA: Diagnosis not present

## 2020-07-19 NOTE — Progress Notes (Signed)
Primary Care Physician: Keith Hilding, MD Referring Physician: Dr. Creola Corn D [name] is a 70 y.o. male with a h/o  GERD, HLD, bradycardia,previously seen by Dr Keith Luna in 2017 for hx of orthostatic dizziness with symptom improvement after salt intake. Hx epiosdes of sinus bradycardia / low heart rates at times. Episodes several month apart. Pulses felt by patient at 44- 55 at baseline.    Was dx with afib with SVR in March. Was seen by Keith Dung, NP,  at that time and started on anticoagulation for a CHA2DS2VASc score of 1(age). He proceeded to cardioversion after 3 weeks of anticoagulation. It was successful but unfortunately only held for around 4 days. He saw improvement with less fatigue and dizziness with activity. EKG shows afib in the low 60's, no rate control on board. He rarely drinks alcohol, no tobacco, normal weight, exercises on a regular basis. Wife has noted some snoring but no apnea.   Today, he denies symptoms of palpitations, chest pain, shortness of breath, orthopnea, PND, lower extremity edema, dizziness, presyncope, syncope, or neurologic sequela. The patient is tolerating medications without difficulties and is otherwise without complaint today.   Past Medical History:  Diagnosis Date  . GERD (gastroesophageal reflux disease)   . Hyperlipidemia   . Orthostatic lightheadedness    Past Surgical History:  Procedure Laterality Date  . CARDIOVERSION N/A 07/07/2020   Procedure: CARDIOVERSION;  Surgeon: Satira Sark, MD;  Location: [facility];  Service: Cardiovascular;  Laterality: N/A;  . COLONOSCOPY    . TONSILLECTOMY      Current Outpatient Medications  Medication Sig Dispense Refill  . atorvastatin (LIPITOR) 20 MG tablet Take 10 mg by mouth daily.    . Biotin 1000 MCG tablet Take 1,000 mcg by mouth daily.    Marland Kitchen ELIQUIS 5 MG TABS tablet Take 5 mg by mouth 2 (two) times daily.    . Influenza Vac High-Dose Quad (FLUZONE HIGH-DOSE QUADRIVALENT) 0.7 ML SUSY  Inject as directed See admin instructions.    . Melatonin-Pyridoxine (MELATIN PO) Take by mouth.    Marland Kitchen omeprazole (PRILOSEC) 20 MG capsule Take 20 mg by mouth daily.  3   No current facility-administered medications for this encounter.    No Known Allergies  Social History   Socioeconomic History  . Marital status: Married    Spouse name: Keith Luna  . Number of children: Not on file  . Years of education: Not on file  . Highest education level: Not on file  Occupational History  . Occupation: Retired  Tobacco Use  . Smoking status: Never Smoker  . Smokeless tobacco: Never Used  Substance and Sexual Activity  . Alcohol use: Yes    Alcohol/week: 1.0 standard drink    Types: 1 Cans of beer per week    Comment: occ  . Drug use: No  . Sexual activity: Not on file  Other Topics Concern  . Not on file  Social History Narrative   Married   2 children   No regular exercise   Social Determinants of Health   Financial Resource Strain: Not on file  Food Insecurity: Not on file  Transportation Needs: Not on file  Physical Activity: Not on file  Stress: Not on file  Social Connections: Not on file  Intimate Partner Violence: Not on file    Family History  Problem Relation Age of Onset  . Asthma Mother   . Heart failure Father   . Cancer Father   .  Heart attack Sister   . Hyperlipidemia Sister     ROS- All systems are reviewed and negative except as per the HPI above  Physical Exam: Vitals:   07/19/20 1032  BP: (!) 124/92  Pulse: 65  Weight: 89.4 kg  Height: 5\' 11"  (1.803 m)   Wt Readings from Last 3 Encounters:  07/19/20 89.4 kg  07/05/20 87.5 kg  06/08/20 89 kg    Labs: Lab Results  Component Value Date   NA 135 07/05/2020   K 3.9 07/05/2020   CL 103 07/05/2020   CO2 23 07/05/2020   GLUCOSE 96 07/05/2020   BUN 21 07/05/2020   CREATININE 1.20 07/05/2020   CALCIUM 8.6 (L) 07/05/2020   MG 2.0 04/26/2014   No results found for: INR Lab Results   Component Value Date   CHOL 171 04/26/2014   HDL 42 04/26/2014   LDLCALC 104 (H) 04/26/2014   TRIG 127 04/26/2014     GEN- The patient is well appearing, alert and oriented x 3 today.   Head- normocephalic, atraumatic Eyes-  Sclera clear, conjunctiva pink Ears- hearing intact Oropharynx- clear Neck- supple, no JVP Lymph- no cervical lymphadenopathy Lungs- Clear to ausculation bilaterally, normal work of breathing Heart- irregular rate and rhythm, no murmurs, rubs or gallops, PMI not laterally displaced GI- soft, NT, ND, + BS Extremities- no clubbing, cyanosis, or edema MS- no significant deformity or atrophy Skin- no rash or lesion Psych- euthymic mood, full affect Neuro- strength and sensation are intact  EKG- afib at 65 bpm, qrs int 88 ms, qtc 432 ms   Echo- . Left ventricular ejection fraction, by estimation, is 55 to 60%. The  left ventricle has normal function. The left ventricle has no regional  wall motion abnormalities. Left ventricular diastolic parameters are  indeterminate. The average left  ventricular global longitudinal strain is -17.7 %. The global longitudinal  strain is normal.  2. Right ventricular systolic function is normal. The right ventricular  size is normal.  3. Left atrial size was mildly dilated.  4. The mitral valve is normal in structure. Trivial mitral valve  regurgitation. No evidence of mitral stenosis.  5. The aortic valve is tricuspid. There is mild calcification of the  aortic valve. There is mild thickening of the aortic valve. Aortic valve  regurgitation is mild. No aortic stenosis is present.  6. The inferior vena cava is normal in size with greater than 50%  respiratory variability, suggesting right atrial pressure of 3 mmHg.   Assessment and Plan: 1. Persistent afib SVR without rate control on board Dx this past March Successful cardioversion but ERAF Felt much  improved in the 4 days in SR  Discussed options to restore  SR My concern is that he runs 40-50's at baseline so most of the antiarrythmic's will cause addition concern for bradycardia  He is not interested in the hospital sty for tikosyn admit  Therefore, he looks to be a great candidate for front line afib ablation and I will refer to Dr. Rayann Luna to further discuss  2. CHA2DS2VASc score of 1 Continue eliquis 5 mg bid for now If has successful ablation may be able to d/c 3 months after procedure   Referral placed for Dr. Lawrence Luna C. [place], [facility] [address] [place], [place] [address] [phone]

## 2020-07-19 NOTE — Progress Notes (Signed)
Thanks, Donna 

## 2020-07-21 ENCOUNTER — Ambulatory Visit: Payer: Medicare Other | Admitting: Family Medicine

## 2020-08-21 ENCOUNTER — Other Ambulatory Visit: Payer: Self-pay

## 2020-08-21 ENCOUNTER — Encounter: Payer: Self-pay | Admitting: Internal Medicine

## 2020-08-21 ENCOUNTER — Encounter: Payer: Self-pay | Admitting: *Deleted

## 2020-08-21 ENCOUNTER — Ambulatory Visit (INDEPENDENT_AMBULATORY_CARE_PROVIDER_SITE_OTHER): Payer: Medicare Other | Admitting: Internal Medicine

## 2020-08-21 VITALS — BP 122/68 | HR 51 | Ht 71.0 in | Wt 195.2 lb

## 2020-08-21 DIAGNOSIS — D6869 Other thrombophilia: Secondary | ICD-10-CM

## 2020-08-21 DIAGNOSIS — I4819 Other persistent atrial fibrillation: Secondary | ICD-10-CM | POA: Diagnosis not present

## 2020-08-21 DIAGNOSIS — Z87898 Personal history of other specified conditions: Secondary | ICD-10-CM

## 2020-08-21 NOTE — Patient Instructions (Addendum)
Medication Instructions:  Your physician recommends that you continue on your current medications as directed. Please refer to the Current Medication list given to you today.  Labwork: None ordered.  Testing/Procedures: Your physician has requested that you have cardiac CT. Cardiac computed tomography (CT) is a painless test that uses an x-ray machine to take clear, detailed pictures of your heart. For further information please visit [employer]. Please follow instruction sheet as given.   Your physician has recommended that you have an ablation. Catheter ablation is a medical procedure used to treat some cardiac arrhythmias (irregular heartbeats). During catheter ablation, a long, thin, flexible tube is put into a blood vessel in your groin (upper thigh), or neck. This tube is called an ablation catheter. It is then guided to your heart through the blood vessel. Radio frequency waves destroy small areas of heart tissue where abnormal heartbeats may cause an arrhythmia to start. Please see the instruction sheet given to you today.  Any Other Special Instructions Will Be Listed Below (If Applicable).  If you need a refill on your cardiac medications before your next appointment, please call your pharmacy.   Cardiac Ablation Cardiac ablation is a procedure to destroy (ablate) some heart tissue that is sending bad signals. These bad signals causeproblems in heart rhythm. The heart has many areas that make these signals. If there are problems in these areas, they can make the heart beat in a way that is not normal.Destroying some tissues can help make the heart rhythm normal. Tell your doctor about: Any allergies you have. All medicines you are taking. These include vitamins, herbs, eye drops, creams, and over-the-counter medicines. Any problems you or family members have had with medicines that make you fall asleep (anesthetics). Any blood disorders you have. Any surgeries you have  had. Any medical conditions you have, such as kidney failure. Whether you are pregnant or may be pregnant. What are the risks? This is a safe procedure. But problems may occur, including: Infection. Bruising and bleeding. Bleeding into the chest. Stroke or blood clots. Damage to nearby areas of your body. Allergies to medicines or dyes. The need for a pacemaker if the normal system is damaged. Failure of the procedure to treat the problem. What happens before the procedure? Medicines Ask your doctor about: Changing or stopping your normal medicines. This is important. Taking aspirin and ibuprofen. Do not take these medicines unless your doctor tells you to take them. Taking other medicines, vitamins, herbs, and supplements. General instructions Follow instructions from your doctor about what you cannot eat or drink. Plan to have someone take you home from the hospital or clinic. If you will be going home right after the procedure, plan to have someone with you for 24 hours. Ask your doctor what steps will be taken to prevent infection. What happens during the procedure?  An IV tube will be put into one of your veins. You will be given a medicine to help you relax. The skin on your neck or groin will be numbed. A cut (incision) will be made in your neck or groin. A needle will be put through your cut and into a large vein. A tube (catheter) will be put into the needle. The tube will be moved to your heart. Dye may be put through the tube. This helps your doctor see your heart. Small devices (electrodes) on the tube will send out signals. A type of energy will be used to destroy some heart tissue. The tube will be  taken out. Pressure will be held on your cut. This helps stop bleeding. A bandage will be put over your cut. The exact procedure may vary among doctors and hospitals. What happens after the procedure? You will be watched until you leave the hospital or clinic. This  includes checking your heart rate, breathing rate, oxygen, and blood pressure. Your cut will be watched for bleeding. You will need to lie still for a few hours. Do not drive for 24 hours or as long as your doctor tells you. Summary Cardiac ablation is a procedure to destroy some heart tissue. This is done to treat heart rhythm problems. Tell your doctor about any medical conditions you may have. Tell him or her about all medicines you are taking to treat them. This is a safe procedure. But problems may occur. These include infection, bruising, bleeding, and damage to nearby areas of your body. Follow what your doctor tells you about food and drink. You may also be told to change or stop some of your medicines. After the procedure, do not drive for 24 hours or as long as your doctor tells you. This information is not intended to replace advice given to you by your health care provider. Make sure you discuss any questions you have with your healthcare provider. Document Revised: 01/28/2019 Document Reviewed: 01/28/2019 Elsevier Patient Education  2022 [employer].

## 2020-08-21 NOTE — Progress Notes (Signed)
Electrophysiology Office Note   Date:  08/21/2020   ID:  Keith SIBAL Sr., DOB 20-Nov-1950, MRN [id number]  PCP:  Manon Hilding, MD  Cardiologist:  Dr Domenic Polite Primary Electrophysiologist: Thompson Grayer, MD    CC: afib   History of Present Illness: Keith Seton Sr. is a 70 y.o. male who presents today for electrophysiology evaluation.   The patient is referred by Dr Domenic Polite and Roderic Palau NP for EP evaluation of bradycardia and afib. The patient has longstanding bradycardia with heart rates 40s-50s.  He is mostly asymptomatic.  He thinks in retrospect that he has had afib for several years. He developed afib in March of this year and was started on [facility].  He underwent subsequent [facility].  He returned to afib after about 4 days.  During afib, he has fatigue, dizziness, and reduced exercise tolerance.  His AF has been intermittent over the past month. Medical therapy for afib is limited by bradycardia.  Today, he denies symptoms of chest pain, shortness of breath, orthopnea, PND, lower extremity edema, claudication, presyncope, syncope, bleeding, or neurologic sequela. The patient is tolerating medications without difficulties and is otherwise without complaint today.    Past Medical History:  Diagnosis Date   GERD (gastroesophageal reflux disease)    Hyperlipidemia    Orthostatic lightheadedness    Persistent atrial fibrillation (HCC)    Sinus bradycardia    Past Surgical History:  Procedure Laterality Date   CARDIOVERSION N/A 07/07/2020   Procedure: CARDIOVERSION;  Surgeon: Satira Sark, MD;  Location: [facility];  Service: Cardiovascular;  Laterality: N/A;   COLONOSCOPY     TONSILLECTOMY       Current Outpatient Medications  Medication Sig Dispense Refill   atorvastatin (LIPITOR) 20 MG tablet Take 10 mg by mouth daily.     Biotin 1000 MCG tablet Take 1,000 mcg by mouth daily.     ELIQUIS 5 MG TABS tablet Take 5 mg by mouth 2 (two) times daily.     Influenza Vac  High-Dose Quad (FLUZONE HIGH-DOSE QUADRIVALENT) 0.7 ML SUSY Inject as directed See admin instructions.     Melatonin-Pyridoxine (MELATIN PO) Take by mouth.     omeprazole (PRILOSEC) 20 MG capsule Take 20 mg by mouth daily.  3   No current facility-administered medications for this visit.    Allergies:   Patient has no known allergies.   Social History:  The patient  reports that he has never smoked. He has never used smokeless tobacco. He reports current alcohol use of about 1.0 standard drink of alcohol per week. He reports that he does not use drugs.   Family History:  The patient's  family history includes Asthma in his mother; Cancer in his father; Heart attack in his sister; Heart failure in his father; Hyperlipidemia in his sister.    ROS:  Please see the history of present illness.   All other systems are personally reviewed and negative.    PHYSICAL EXAM: VS:  BP 122/68   Pulse (!) 51   Ht 5\' 11"  (1.803 m)   Wt 195 lb 3.2 oz (88.5 kg)   SpO2 97%   BMI 27.22 kg/m  , BMI Body mass index is 27.22 kg/m. GEN: Well nourished, well developed, in no acute distress HEENT: normal Neck: no JVD, carotid bruits, or masses Cardiac: RRR; no murmurs, rubs, or gallops,no edema  Respiratory:  clear to auscultation bilaterally, normal work of breathing GI: soft, nontender, nondistended, + BS MS: no deformity  or atrophy Skin: warm and dry  Neuro:  Strength and sensation are intact Psych: euthymic mood, full affect  EKG:  EKG is ordered today. The ekg ordered today is personally reviewed and shows sinus bradycardia 51 bpm   Recent Labs: 07/05/2020: BUN 21; Creatinine, Ser 1.20; Potassium 3.9; Sodium 135  personally reviewed   Lipid Panel     Component Value Date/Time   CHOL 171 04/26/2014 1002   TRIG 127 04/26/2014 1002   HDL 42 04/26/2014 1002   CHOLHDL 4.1 04/26/2014 1002   VLDL 25 04/26/2014 1002   LDLCALC 104 (H) 04/26/2014 1002   personally reviewed   Wt Readings from  Last 3 Encounters:  08/21/20 195 lb 3.2 oz (88.5 kg)  07/19/20 197 lb 3.2 oz (89.4 kg)  07/05/20 193 lb (87.5 kg)      Other studies personally reviewed: Additional studies/ records that were reviewed today include: prior echo, AF clinic notes, ekgs  Review of the above records today demonstrates: as above   ASSESSMENT AND PLAN:  1.  Persistent afib The patient has symptomatic, recurrent  atrial fibrillation. Medical therapy is limited by bradycardia Chads2vasc score is 1.  he is anticoagulated with eliquis . Therapeutic strategies for afib including medicine (tikosyn) and ablation were discussed in detail with the patient today. Risk, benefits, and alternatives to EP study and radiofrequency ablation for afib were also discussed in detail today. These risks include but are not limited to stroke, bleeding, vascular damage, tamponade, perforation, damage to the esophagus, lungs, and other structures, pulmonary vein stenosis, worsening renal function, and death. The patient understands these risk and wishes to proceed.  We will therefore proceed with catheter ablation at the next available time.  Carto, ICE, anesthesia are requested for the procedure.  Will also obtain cardiac CT prior to the procedure to exclude LAA thrombus and further evaluate atrial anatomy.  2. Sinus bradycardia Asymptomatic I have offered PPM with subsequent AADs for afib.  He is clear that he would like to avoid ppm if possible.  Risks, benefits and potential toxicities for medications prescribed and/or refilled reviewed with patient today.    Army Fossa, MD  08/21/2020 12:34 PM     [place] [address] [phone] (office) [phone] (fax)

## 2020-09-18 ENCOUNTER — Other Ambulatory Visit: Payer: Self-pay

## 2020-09-18 ENCOUNTER — Other Ambulatory Visit: Payer: Medicare Other

## 2020-09-18 DIAGNOSIS — Z87898 Personal history of other specified conditions: Secondary | ICD-10-CM | POA: Diagnosis not present

## 2020-09-18 DIAGNOSIS — D6869 Other thrombophilia: Secondary | ICD-10-CM | POA: Diagnosis not present

## 2020-09-18 DIAGNOSIS — I4819 Other persistent atrial fibrillation: Secondary | ICD-10-CM | POA: Diagnosis not present

## 2020-09-18 LAB — BASIC METABOLIC PANEL
BUN/Creatinine Ratio: 13 (ref 10–24)
BUN: 17 mg/dL (ref 8–27)
CO2: 26 mmol/L (ref 20–29)
Calcium: 9.9 mg/dL (ref 8.6–10.2)
Chloride: 105 mmol/L (ref 96–106)
Creatinine, Ser: 1.31 mg/dL — ABNORMAL HIGH (ref 0.76–1.27)
Glucose: 104 mg/dL — ABNORMAL HIGH (ref 65–99)
Potassium: 4.3 mmol/L (ref 3.5–5.2)
Sodium: 139 mmol/L (ref 134–144)
eGFR: 59 mL/min/{1.73_m2} — ABNORMAL LOW (ref >59)

## 2020-09-18 LAB — CBC WITH DIFFERENTIAL/PLATELET
Basophils Absolute: 0 10*3/uL (ref 0.0–0.2)
Basos: 0 %
EOS (ABSOLUTE): 0.2 10*3/uL (ref 0.0–0.4)
Eos: 3 %
Hematocrit: 43.4 % (ref 37.5–51.0)
Hemoglobin: 15 g/dL (ref 13.0–17.7)
Lymphocytes Absolute: 2.2 10*3/uL (ref 0.7–3.1)
Lymphs: 32 %
MCH: 33.3 pg — ABNORMAL HIGH (ref 26.6–33.0)
MCHC: 34.6 g/dL (ref 31.5–35.7)
MCV: 96 fL (ref 79–97)
Monocytes Absolute: 0.6 10*3/uL (ref 0.1–0.9)
Monocytes: 9 %
Neutrophils Absolute: 3.7 10*3/uL (ref 1.4–7.0)
Neutrophils: 56 %
Platelets: 225 10*3/uL (ref 150–450)
RBC: 4.51 x10E6/uL (ref 4.14–5.80)
RDW: 13.5 % (ref 11.6–15.4)
WBC: 6.8 10*3/uL (ref 3.4–10.8)

## 2020-09-27 DIAGNOSIS — L237 Allergic contact dermatitis due to plants, except food: Secondary | ICD-10-CM | POA: Diagnosis not present

## 2020-09-28 ENCOUNTER — Telehealth (HOSPITAL_COMMUNITY): Payer: Self-pay | Admitting: Emergency Medicine

## 2020-09-28 NOTE — Telephone Encounter (Signed)
Attempted to call patient regarding upcoming cardiac CT appointment. °Left message on voicemail with name and callback number °Latron Ribas RN Navigator Cardiac Imaging °[facility] °[phone] Office °[phone] Cell ° °

## 2020-09-28 NOTE — Telephone Encounter (Signed)
Reaching out to patient to offer assistance regarding upcoming cardiac imaging study; pt verbalizes understanding of appt date/time, parking situation and where to check in, pre-test NPO status and medications ordered, and verified current allergies; name and call back number provided for further questions should they arise Moyses Pavey RN Navigator Cardiac Imaging [facility] [phone] office [phone] cell  Denies claustro Denies iv issues  

## 2020-09-29 ENCOUNTER — Other Ambulatory Visit: Payer: Self-pay

## 2020-09-29 ENCOUNTER — Ambulatory Visit (HOSPITAL_COMMUNITY)
Admission: RE | Admit: 2020-09-29 | Discharge: 2020-09-29 | Disposition: A | Discharge status: Home / Self Care | Payer: Medicare Other | Source: Ambulatory Visit | Attending: Internal Medicine | Admitting: Internal Medicine

## 2020-09-29 DIAGNOSIS — I4819 Other persistent atrial fibrillation: Secondary | ICD-10-CM | POA: Diagnosis not present

## 2020-09-29 IMAGING — CT CT HEART MORPH/PULM VEIN W/ CM & W/O CA SCORE
2 of 7 series · 11 of 20 positions shown, 13 images · IV contrast (Omni 300)
Comparison: None.

Addendum:
CLINICAL DATA: Atrial fibrillation scheduled for ablation.

EXAM:
Cardiac CTA
TECHNIQUE: A non-contrast, gated CT scan was obtained with axial slices of 3 mm
through the heart for calcium scoring. Calcium scoring was performed
using the Agatston method. A 120 kV retrospective, gated, contrast
cardiac scan was obtained. Gantry rotation speed was 250 msecs and
collimation was 0.6 mm. Nitroglycerin was not given. A delayed scan
was obtained 60 seconds after contrast injection to exclude left
atrial appendage thrombus. The 3D dataset was reconstructed in 5%
intervals of the 0-95% of the R-R cycle. Systolic and diastolic
phases were analyzed on a dedicated workstation using MPR, MIP, and
VRT modes. The patient received 80 cc of contrast.

[Series 9: 0-90% · axial · 0.43mm/px · z∈[+1278,+1371]mm · 5 of 2790 slices shown]
[im 465/2790  vessel]
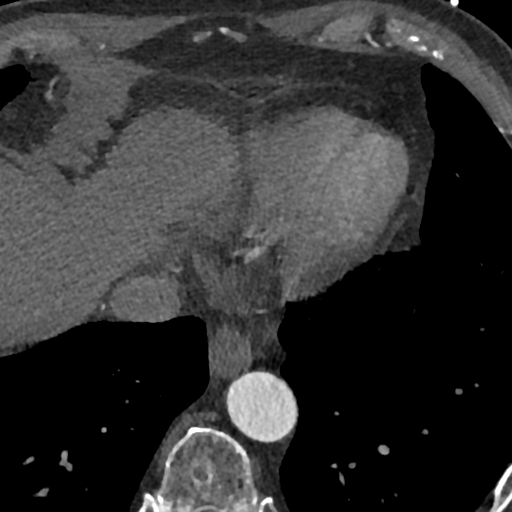
[im 930/2790  vessel]
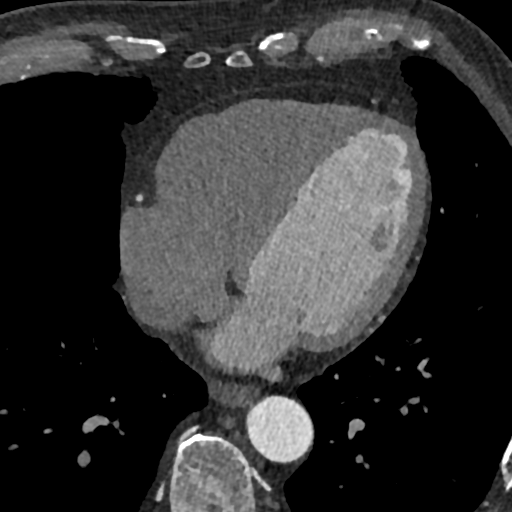
[im 1395/2790  vessel]
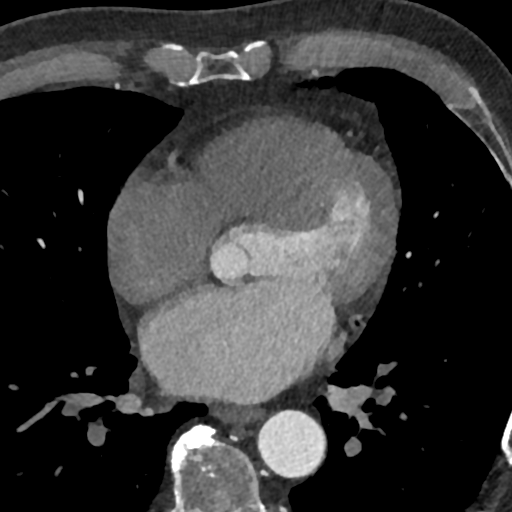
[im 1860/2790  vessel]
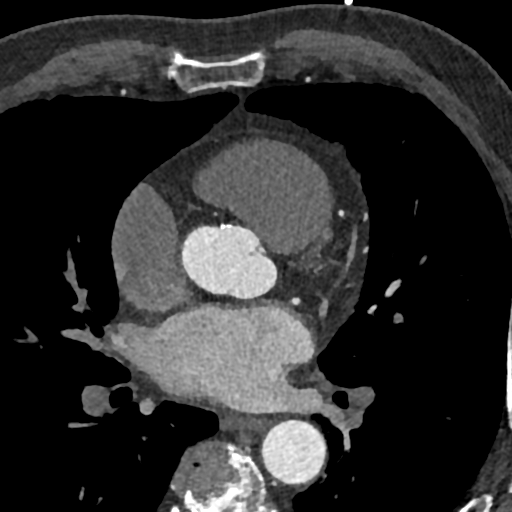
[im 2325/2790  vessel]
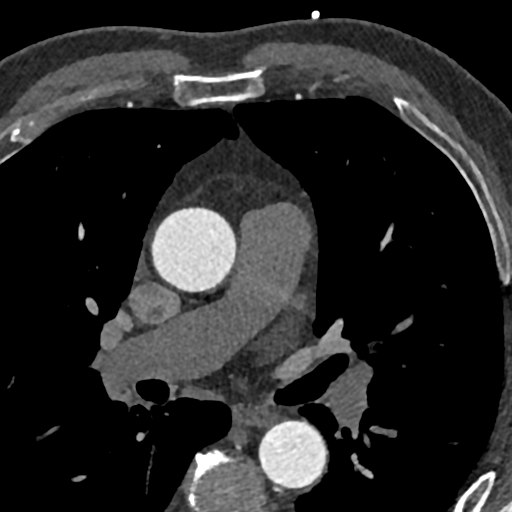

[Series 15: 5-95% · axial · 0.82mm/px · z∈[+1274,+1374]mm · 6 of 2790 slices shown, 8 images]
[im 399/2790  vessel]
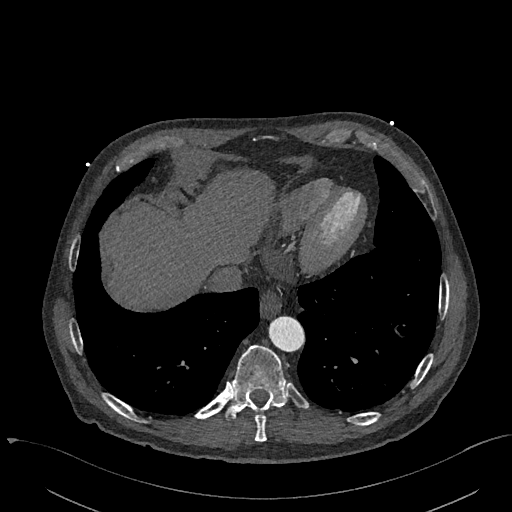
[im 399/2790  lung]
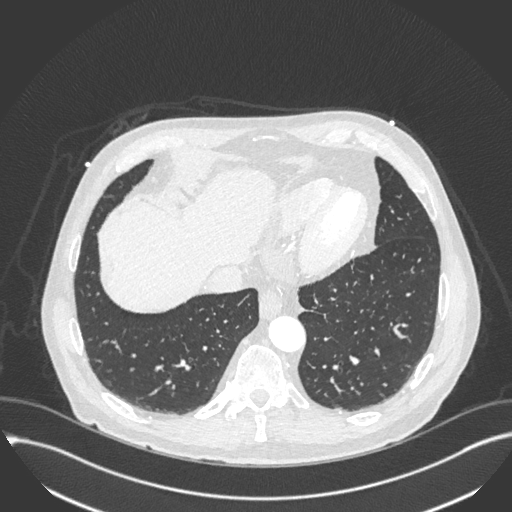
[im 797/2790  vessel]
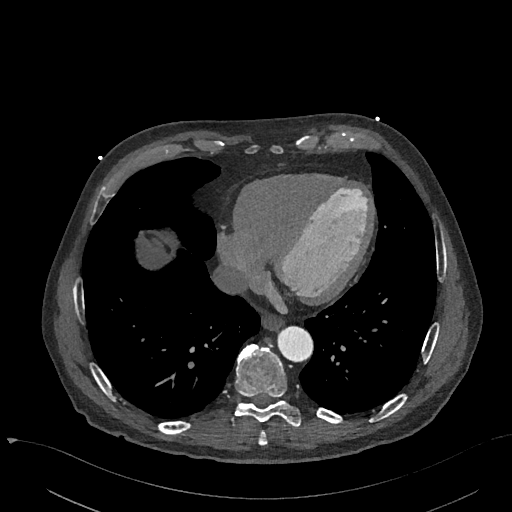
[im 1196/2790  vessel]
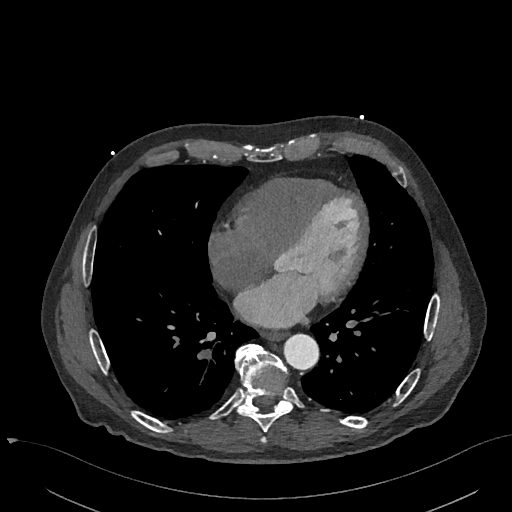
[im 1594/2790  vessel]
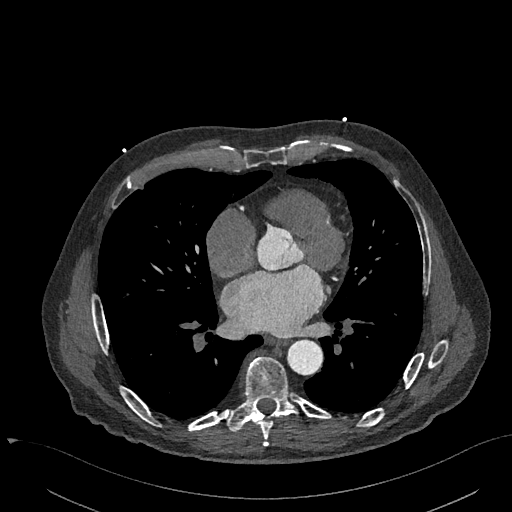
[im 1993/2790  vessel]
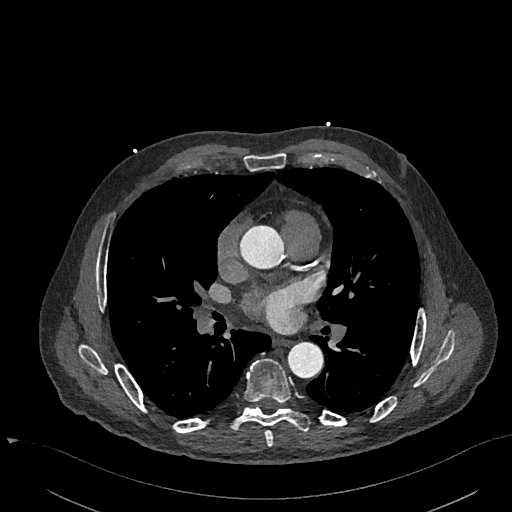
[im 1993/2790  lung]
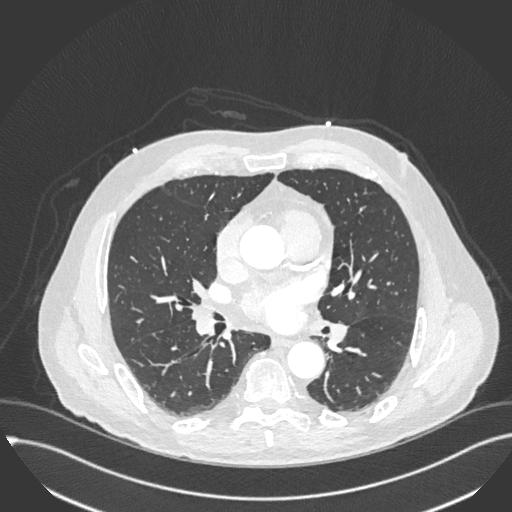
[im 2391/2790  vessel]
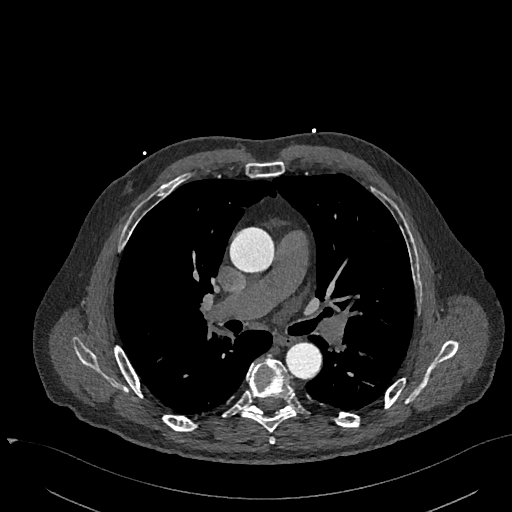

[11 of 20 positions shown; findings below may reference images not displayed]

FINDINGS: Image quality: Excellent.

Noise artifact is: Limited.

Pulmonary Veins: There is normal pulmonary vein drainage into the
left atrium (2 on the right and 2 on the left) with ostial
measurements as follows:

RUPV: Ostium 23 x 17 mm  area 2.9 cm2

RLPV:  Ostium 22 x 19 mm  area 3.2 cm2

LUPV:  Ostium 20 x 16 mm area 2.5 cm2

LLPV:  Ostium 20 x 14 mm  area 2.0 cm2

Left Atrium: There is no PFO/ASD. There is no thrombus in the left
atrial appendage on contrast or delayed imaging. The esophagus runs
in the left atrial midline and is not in proximity to any of the
pulmonary vein ostia.

Coronary Calicum score:

LM: 28

LAD: 192

LCX: 9

RCA: 0

Total CAC score of 229, which is 58 percentile for age-, race-, and
sex-matched controls. Normal coronary origin. The study was
performed without use of NTG and is insufficient for plaque
evaluation.

Pericardium: Normal thickness with no significant effusion or
calcium present.

Pulmonary Artery: Normal caliber without proximal filling defect.

Cardiac valves: The aortic valve is trileaflet without significant
calcification. The mitral valve is normal structure without
significant calcification.

Aorta: Normal caliber with proximal atherosclerosis.

Extra-cardiac findings: See attached radiology report for
non-cardiac structures.
IMPRESSION: 1. There is normal pulmonary vein drainage into the left atrium with
ostial measurements above.

2. There is no thrombus in the left atrial appendage.

3. The esophagus runs in the left atrial midline and is not in
proximity to any of the pulmonary vein ostia.

4. No PFO/ASD.

5. Normal coronary origin.

6. CAC score of 229 which is 58 percentile for age-, race-, and
sex-matched controls.

7.  Proximal aortic atherosclerosis.

EXAM:
OVER-READ INTERPRETATION  CT CHEST

The following report is an over-read performed by radiologist Dr.
over-read does not include interpretation of cardiac or coronary
anatomy or pathology. The coronary calcium score and cardiac CTA
interpretation by the cardiologist is attached.
FINDINGS: Within the visualized portions of the thorax there are no suspicious
appearing pulmonary nodules or masses, there is no acute
consolidative airspace disease, no pleural effusions, no
pneumothorax and no lymphadenopathy. Visualized portions of the
upper abdomen are unremarkable. There are no aggressive appearing
lytic or blastic lesions noted in the visualized portions of the
skeleton.
IMPRESSION: 1. No significant incidental noncardiac findings are noted.

*** End of Addendum ***
FINDINGS: Image quality: Excellent.

Noise artifact is: Limited.

Pulmonary Veins: There is normal pulmonary vein drainage into the
left atrium (2 on the right and 2 on the left) with ostial
measurements as follows:

RUPV: Ostium 23 x 17 mm  area 2.9 cm2

RLPV:  Ostium 22 x 19 mm  area 3.2 cm2

LUPV:  Ostium 20 x 16 mm area 2.5 cm2

LLPV:  Ostium 20 x 14 mm  area 2.0 cm2

Left Atrium: There is no PFO/ASD. There is no thrombus in the left
atrial appendage on contrast or delayed imaging. The esophagus runs
in the left atrial midline and is not in proximity to any of the
pulmonary vein ostia.

Coronary Calicum score:

LM: 28

LAD: 192

LCX: 9

RCA: 0

Total CAC score of 229, which is 58 percentile for age-, race-, and
sex-matched controls. Normal coronary origin. The study was
performed without use of NTG and is insufficient for plaque
evaluation.

Pericardium: Normal thickness with no significant effusion or
calcium present.

Pulmonary Artery: Normal caliber without proximal filling defect.

Cardiac valves: The aortic valve is trileaflet without significant
calcification. The mitral valve is normal structure without
significant calcification.

Aorta: Normal caliber with proximal atherosclerosis.

Extra-cardiac findings: See attached radiology report for
non-cardiac structures.
IMPRESSION: 1. There is normal pulmonary vein drainage into the left atrium with
ostial measurements above.

2. There is no thrombus in the left atrial appendage.

3. The esophagus runs in the left atrial midline and is not in
proximity to any of the pulmonary vein ostia.

4. No PFO/ASD.

5. Normal coronary origin.

6. CAC score of 229 which is 58 percentile for age-, race-, and
sex-matched controls.

7.  Proximal aortic atherosclerosis.

## 2020-09-29 MED ORDER — IOHEXOL 350 MG/ML SOLN
80.0000 mL | Freq: Once | INTRAVENOUS | Status: AC | PRN
  Administered 2020-09-29: 80 mL via INTRAVENOUS

## 2020-10-05 NOTE — Pre-Procedure Instructions (Signed)
Attempted to call patient regarding procedure instructions for tomorrow.  Left voice mail on the following items: Arrival time 0830 Nothing to eat or drink after midnight No meds AM of procedure Responsible person to drive you home and stay with you for 24 hrs  Have you missed any doses of anti-coagulant Eliquis- take both doses today, none in the morning

## 2020-10-06 ENCOUNTER — Ambulatory Visit (HOSPITAL_COMMUNITY)
Admission: RE | Admit: 2020-10-06 | Discharge: 2020-10-06 | Disposition: A | Discharge status: Home / Self Care | Payer: Medicare Other | Attending: Internal Medicine | Admitting: Internal Medicine

## 2020-10-06 ENCOUNTER — Encounter (HOSPITAL_COMMUNITY)
Admission: RE | Disposition: A | Discharge status: Home / Self Care | Payer: Self-pay | Source: Home / Self Care | Attending: Internal Medicine

## 2020-10-06 ENCOUNTER — Ambulatory Visit (HOSPITAL_COMMUNITY): Payer: Medicare Other | Admitting: Certified Registered Nurse Anesthetist

## 2020-10-06 ENCOUNTER — Other Ambulatory Visit: Payer: Self-pay

## 2020-10-06 DIAGNOSIS — I4819 Other persistent atrial fibrillation: Secondary | ICD-10-CM | POA: Insufficient documentation

## 2020-10-06 DIAGNOSIS — K219 Gastro-esophageal reflux disease without esophagitis: Secondary | ICD-10-CM | POA: Diagnosis not present

## 2020-10-06 DIAGNOSIS — E785 Hyperlipidemia, unspecified: Secondary | ICD-10-CM | POA: Diagnosis not present

## 2020-10-06 DIAGNOSIS — Z7901 Long term (current) use of anticoagulants: Secondary | ICD-10-CM | POA: Insufficient documentation

## 2020-10-06 DIAGNOSIS — Z79899 Other long term (current) drug therapy: Secondary | ICD-10-CM | POA: Insufficient documentation

## 2020-10-06 DIAGNOSIS — Z8249 Family history of ischemic heart disease and other diseases of the circulatory system: Secondary | ICD-10-CM | POA: Diagnosis not present

## 2020-10-06 HISTORY — PX: ATRIAL FIBRILLATION ABLATION: EP1191

## 2020-10-06 LAB — POCT ACTIVATED CLOTTING TIME
Activated Clotting Time: 289 seconds
Activated Clotting Time: 312 seconds
Activated Clotting Time: 312 seconds
Activated Clotting Time: 329 seconds

## 2020-10-06 SURGERY — ATRIAL FIBRILLATION ABLATION
Anesthesia: General

## 2020-10-06 MED ORDER — SODIUM CHLORIDE 0.9 % IV SOLN: 250.0000 mL | INTRAVENOUS | Status: DC | PRN

## 2020-10-06 MED ORDER — EPHEDRINE SULFATE-NACL 50-0.9 MG/10ML-% IV SOSY
PREFILLED_SYRINGE | INTRAVENOUS | Status: DC | PRN
  Administered 2020-10-06 (×3): 5 mg via INTRAVENOUS

## 2020-10-06 MED ORDER — SUGAMMADEX SODIUM 200 MG/2ML IV SOLN
INTRAVENOUS | Status: DC | PRN
  Administered 2020-10-06 (×2): 100 mg via INTRAVENOUS
  Administered 2020-10-06: 50 mg via INTRAVENOUS

## 2020-10-06 MED ORDER — APIXABAN 5 MG PO TABS
5.0000 mg | ORAL_TABLET | Freq: Once | ORAL | Status: AC
  Administered 2020-10-06: 5 mg via ORAL
  Filled 2020-10-06: qty 1

## 2020-10-06 MED ORDER — HEPARIN SODIUM (PORCINE) 1000 UNIT/ML IJ SOLN
INTRAMUSCULAR | Status: AC
  Filled 2020-10-06: qty 2

## 2020-10-06 MED ORDER — PROPOFOL 10 MG/ML IV BOLUS
INTRAVENOUS | Status: DC | PRN
  Administered 2020-10-06: 160 mg via INTRAVENOUS

## 2020-10-06 MED ORDER — HEPARIN (PORCINE) IN NACL 1000-0.9 UT/500ML-% IV SOLN
INTRAVENOUS | Status: AC
  Filled 2020-10-06: qty 500

## 2020-10-06 MED ORDER — DEXAMETHASONE SODIUM PHOSPHATE 10 MG/ML IJ SOLN
INTRAMUSCULAR | Status: DC | PRN
  Administered 2020-10-06: 5 mg via INTRAVENOUS

## 2020-10-06 MED ORDER — PROTAMINE SULFATE 10 MG/ML IV SOLN
INTRAVENOUS | Status: DC | PRN
  Administered 2020-10-06: 40 mg via INTRAVENOUS

## 2020-10-06 MED ORDER — HEPARIN SODIUM (PORCINE) 1000 UNIT/ML IJ SOLN
INTRAMUSCULAR | Status: DC | PRN
  Administered 2020-10-06 (×3): 2000 [IU] via INTRAVENOUS
  Administered 2020-10-06: 3000 [IU] via INTRAVENOUS

## 2020-10-06 MED ORDER — LIDOCAINE 2% (20 MG/ML) 5 ML SYRINGE
INTRAMUSCULAR | Status: DC | PRN
  Administered 2020-10-06: 60 mg via INTRAVENOUS

## 2020-10-06 MED ORDER — HYDROCODONE-ACETAMINOPHEN 5-325 MG PO TABS: 1.0000 | ORAL_TABLET | ORAL | Status: DC | PRN

## 2020-10-06 MED ORDER — SODIUM CHLORIDE 0.9 % IV SOLN: INTRAVENOUS | Status: DC

## 2020-10-06 MED ORDER — ROCURONIUM BROMIDE 10 MG/ML (PF) SYRINGE
PREFILLED_SYRINGE | INTRAVENOUS | Status: DC | PRN
  Administered 2020-10-06: 10 mg via INTRAVENOUS
  Administered 2020-10-06: 60 mg via INTRAVENOUS
  Administered 2020-10-06: 10 mg via INTRAVENOUS

## 2020-10-06 MED ORDER — FENTANYL CITRATE (PF) 250 MCG/5ML IJ SOLN
INTRAMUSCULAR | Status: DC | PRN
  Administered 2020-10-06: 100 ug via INTRAVENOUS

## 2020-10-06 MED ORDER — SODIUM CHLORIDE 0.9% FLUSH: 3.0000 mL | INTRAVENOUS | Status: DC | PRN

## 2020-10-06 MED ORDER — HEPARIN (PORCINE) IN NACL 1000-0.9 UT/500ML-% IV SOLN
INTRAVENOUS | Status: DC | PRN
  Administered 2020-10-06: 500 mL

## 2020-10-06 MED ORDER — EPHEDRINE SULFATE 50 MG/ML IJ SOLN
INTRAMUSCULAR | Status: DC | PRN
  Administered 2020-10-06: 5 mg via INTRAVENOUS
  Administered 2020-10-06: 10 mg via INTRAVENOUS

## 2020-10-06 MED ORDER — PHENYLEPHRINE HCL-NACL 10-0.9 MG/250ML-% IV SOLN
INTRAVENOUS | Status: DC | PRN
  Administered 2020-10-06: 30 ug/min via INTRAVENOUS

## 2020-10-06 MED ORDER — ACETAMINOPHEN 325 MG PO TABS
650.0000 mg | ORAL_TABLET | ORAL | Status: DC | PRN
  Filled 2020-10-06: qty 2

## 2020-10-06 MED ORDER — LACTATED RINGERS IV SOLN
INTRAVENOUS | Status: DC | PRN
Start: 2020-10-06 — End: 2020-10-06

## 2020-10-06 MED ORDER — PHENYLEPHRINE 40 MCG/ML (10ML) SYRINGE FOR IV PUSH (FOR BLOOD PRESSURE SUPPORT)
PREFILLED_SYRINGE | INTRAVENOUS | Status: DC | PRN
  Administered 2020-10-06: 40 ug via INTRAVENOUS
  Administered 2020-10-06 (×2): 80 ug via INTRAVENOUS
  Administered 2020-10-06: 40 ug via INTRAVENOUS
  Administered 2020-10-06: 80 ug via INTRAVENOUS
  Administered 2020-10-06: 40 ug via INTRAVENOUS

## 2020-10-06 MED ORDER — ONDANSETRON HCL 4 MG/2ML IJ SOLN
INTRAMUSCULAR | Status: DC | PRN
  Administered 2020-10-06: 4 mg via INTRAVENOUS

## 2020-10-06 MED ORDER — SODIUM CHLORIDE 0.9% FLUSH: 3.0000 mL | Freq: Two times a day (BID) | INTRAVENOUS | Status: DC

## 2020-10-06 MED ORDER — ONDANSETRON HCL 4 MG/2ML IJ SOLN: 4.0000 mg | Freq: Four times a day (QID) | INTRAMUSCULAR | Status: DC | PRN

## 2020-10-06 MED ORDER — HEPARIN SODIUM (PORCINE) 1000 UNIT/ML IJ SOLN
INTRAMUSCULAR | Status: DC | PRN
  Administered 2020-10-06: 1000 [IU] via INTRAVENOUS
  Administered 2020-10-06: 15000 [IU] via INTRAVENOUS

## 2020-10-06 SURGICAL SUPPLY — 16 items
CATH OCTARAY 2.0 F 3-3-3-3-3 (CATHETERS) ×2 IMPLANT
CATH SMTCH THERMOCOOL SF DF (CATHETERS) ×2 IMPLANT
CATH SOUNDSTAR ECO 8FR (CATHETERS) ×2 IMPLANT
CATH WEBSTER BI DIR CS D-F CRV (CATHETERS) ×2 IMPLANT
CLOSURE PERCLOSE PROSTYLE (VASCULAR PRODUCTS) ×6 IMPLANT
COVER SWIFTLINK CONNECTOR (BAG) ×2 IMPLANT
NEEDLE BAYLIS TRANSSEPTAL 71CM (NEEDLE) ×2 IMPLANT
PACK EP LATEX FREE (CUSTOM PROCEDURE TRAY) ×2
PACK EP LF (CUSTOM PROCEDURE TRAY) ×1 IMPLANT
PAD PRO RADIOLUCENT 2001M-C (PAD) ×2 IMPLANT
PATCH CARTO3 (PAD) ×2 IMPLANT
SHEATH PINNACLE 7F 10CM (SHEATH) ×4 IMPLANT
SHEATH PINNACLE 9F 10CM (SHEATH) ×2 IMPLANT
SHEATH PROBE COVER 6X72 (BAG) ×2 IMPLANT
SHEATH SWARTZ TS SL2 63CM 8.5F (SHEATH) ×2 IMPLANT
TUBING SMART ABLATE COOLFLOW (TUBING) ×2 IMPLANT

## 2020-10-06 NOTE — Discharge Instructions (Signed)
Post procedure care instructions No driving for 4 days. No lifting over 5 lbs for 1 week. No vigorous or sexual activity for 1 week. You may return to work/your usual activities on 10/14/20. Keep procedure site clean & dry. If you notice increased pain, swelling, bleeding or pus, call/return!  You may shower after 24 hours, but no soaking in baths/hot tubs/pools for 1 week.    You have an appointment set up with the [facility].  Multiple studies have shown that being followed by a dedicated atrial fibrillation clinic in addition to the standard care you receive from your other physicians improves health. We believe that enrollment in the atrial fibrillation clinic will allow [place] to better care for you.   The phone number to the [facility] is [phone]. The clinic is staffed Monday through Friday from 8:30am to 5pm.  Parking Directions: The clinic is located in the Heart and Vascular Building connected to [facility] 1)From [address] turn on to [address] and go to the 3rd entrance  (Heart and Vascular entrance) on the right. 2)Look to the right for Heart &Vascular Parking Garage. 3)A code for the entrance is required, for August is 5544.   4)Take the elevators to the 1st floor. Registration is in the room with the glass walls at the end of the hallway.  If you have any trouble parking or locating the clinic, please don't hesitate to call [phone].

## 2020-10-06 NOTE — Anesthesia Procedure Notes (Addendum)
Procedure Name: Intubation Date/Time: 10/06/2020 10:46 AM Performed by: Janene Harvey, CRNA Pre-anesthesia Checklist: Patient identified, Emergency Drugs available, Suction available and Patient being monitored Patient Re-evaluated:Patient Re-evaluated prior to induction Oxygen Delivery Method: Circle system utilized Preoxygenation: Pre-oxygenation with 100% oxygen Induction Type: IV induction Ventilation: Mask ventilation without difficulty Laryngoscope Size: Mac and 4 Grade View: Grade II Tube type: Oral Tube size: 7.5 mm Number of attempts: 1 Airway Equipment and Method: Stylet and Oral airway Placement Confirmation: ETT inserted through vocal cords under direct vision, positive ETCO2 and breath sounds checked- equal and bilateral Secured at: 22 cm Tube secured with: Tape Dental Injury: Teeth and Oropharynx as per pre-operative assessment

## 2020-10-06 NOTE — Anesthesia Preprocedure Evaluation (Signed)
Anesthesia Evaluation  Patient identified by MRN, date of birth, ID band Patient awake    Reviewed: Allergy & Precautions, H&P , NPO status , Patient's Chart, lab work & pertinent test results  Airway Mallampati: II   Neck ROM: full    Dental   Pulmonary neg pulmonary ROS,    breath sounds clear to auscultation       Cardiovascular + dysrhythmias Atrial Fibrillation  Rhythm:regular Rate:Normal     Neuro/Psych    GI/Hepatic GERD  ,  Endo/Other    Renal/GU      Musculoskeletal   Abdominal   Peds  Hematology   Anesthesia Other Findings   Reproductive/Obstetrics                             Anesthesia Physical Anesthesia Plan  ASA: 3  Anesthesia Plan: General   Post-op Pain Management:    Induction: Intravenous  PONV Risk Score and Plan: 2 and Ondansetron, Dexamethasone and Treatment may vary due to age or medical condition  Airway Management Planned: Oral ETT  Additional Equipment:   Intra-op Plan:   Post-operative Plan: Extubation in OR  Informed Consent: I have reviewed the patients History and Physical, chart, labs and discussed the procedure including the risks, benefits and alternatives for the proposed anesthesia with the patient or authorized representative who has indicated his/her understanding and acceptance.     Dental advisory given  Plan Discussed with: CRNA, Anesthesiologist and Surgeon  Anesthesia Plan Comments:         Anesthesia Quick Evaluation  

## 2020-10-06 NOTE — Progress Notes (Signed)
Prior to patient being discharged, patient became diaphoretic and dizzy. Patient laid down and called for help. Patients B/P was 60/38 and HR was 60. Patient denied chest pain or SOB. Patient laid back and B/P taken again, which was 70/47 and HR was 63. Patient stated that he was starting to feel better, but was still complaining of dizziness. B/P re-checked three minutes later and was 79/58 and HR was 65. B/P re-checked three minutes later and was 85/59 and HR 65. Patient stated that he was feeling much better and B/P re-checked and was 96/64 and HR was 75. Patient ambulated and it was 101/74 and HR was 80. Patient completely asymptomatic at 1745. R groin checked and stable. Rapid Response RN at bedside as well. Allred, MD notified and made aware. Patient discharged.

## 2020-10-06 NOTE — Transfer of Care (Signed)
Immediate Anesthesia Transfer of Care Note  Patient: Keith Luna.  Procedure(s) Performed: ATRIAL FIBRILLATION ABLATION  Patient Location: Cath Lab  Anesthesia Type:General  Level of Consciousness: drowsy and patient cooperative  Airway & Oxygen Therapy: Patient Spontanous Breathing and Patient connected to nasal cannula oxygen  Post-op Assessment: Report given to RN and Post -op Vital signs reviewed and stable  Post vital signs: Reviewed  Last Vitals:  Vitals Value Taken Time  BP 95/61 10/06/20 1332  Temp 36.2 C 10/06/20 1331  Pulse 63 10/06/20 1332  Resp 16 10/06/20 1332  SpO2 96 % 10/06/20 1332  Vitals shown include unvalidated device data.  Last Pain:  Vitals:   10/06/20 1331  TempSrc: Temporal  PainSc: 0-No pain         Complications: No notable events documented.

## 2020-10-06 NOTE — H&P (Signed)
CC: afib   History of Present Illness: Keith KAMAI Sr. is a 70 y.o. male who presents today for electrophysiology study and ablation for afib.     The patient has longstanding bradycardia with heart rates 40s-50s.  He is mostly asymptomatic.  He thinks in retrospect that he has had afib for several years. He developed afib in March of this year and was started on [facility].  He underwent subsequent [place].  He returned to afib after about 4 days.  During afib, he has fatigue, dizziness, and reduced exercise tolerance.  His AF has been intermittent over the past month. Medical therapy for afib is limited by bradycardia.   Today, he denies symptoms of chest pain, shortness of breath, orthopnea, PND, lower extremity edema, claudication, presyncope, syncope, bleeding, or neurologic sequela. The patient is tolerating medications without difficulties and is otherwise without complaint today.         Past Medical History:  Diagnosis Date   GERD (gastroesophageal reflux disease)     Hyperlipidemia     Orthostatic lightheadedness     Persistent atrial fibrillation (HCC)     Sinus bradycardia           Past Surgical History:  Procedure Laterality Date   CARDIOVERSION N/A 07/07/2020    Procedure: CARDIOVERSION;  Surgeon: Satira Sark, MD;  Location: [facility];  Service: Cardiovascular;  Laterality: N/A;   COLONOSCOPY       TONSILLECTOMY                  Current Outpatient Medications  Medication Sig Dispense Refill   atorvastatin (LIPITOR) 20 MG tablet Take 10 mg by mouth daily.       Biotin 1000 MCG tablet Take 1,000 mcg by mouth daily.       ELIQUIS 5 MG TABS tablet Take 5 mg by mouth 2 (two) times daily.       Influenza Vac High-Dose Quad (FLUZONE HIGH-DOSE QUADRIVALENT) 0.7 ML SUSY Inject as directed See admin instructions.       Melatonin-Pyridoxine (MELATIN PO) Take by mouth.       omeprazole (PRILOSEC) 20 MG capsule Take 20 mg by mouth daily.   3    No current facility-administered  medications for this visit.      Allergies:   Patient has no known allergies.    Social History:  The patient  reports that he has never smoked. He has never used smokeless tobacco. He reports current alcohol use of about 1.0 standard drink of alcohol per week. He reports that he does not use drugs.    Family History:  The patient's  family history includes Asthma in his mother; Cancer in his father; Heart attack in his sister; Heart failure in his father; Hyperlipidemia in his sister.      ROS:  Please see the history of present illness.   All other systems are personally reviewed and negative.     PHYSICAL EXAM: Vitals:   10/06/20 0843  BP: 117/79  Pulse: (!) 55  Temp: 97.7 F (36.5 C)  SpO2: 98%   GEN: Well nourished, well developed, in no acute distress HEENT: normal Neck: no JVD, carotid bruits, or masses Cardiac: iRRR Respiratory:  clear to auscultation bilaterally, normal work of breathing GI: soft, nontender, nondistended, + BS MS: no deformity or atrophy Skin: warm and dry Neuro:  Strength and sensation are intact Psych: euthymic mood, full affect     Other studies personally reviewed: Additional studies/ records that  were reviewed today include: prior echo, AF clinic notes, ekgs  Review of the above records today demonstrates: as above     ASSESSMENT AND PLAN:   1.  Persistent afib The patient has symptomatic, recurrent  atrial fibrillation. Medical therapy is limited by bradycardia Chads2vasc score is 1.  he is anticoagulated with eliquis .   Risk, benefits, and alternatives to EP study and radiofrequency ablation for afib were again discussed in detail today. These risks include but are not limited to stroke, bleeding, vascular damage, tamponade, perforation, damage to the esophagus, lungs, and other structures, pulmonary vein stenosis, worsening renal function, and death. The patient understands these risk and wishes to proceed.    Cardiac CT reviewed at  length with the patient today.  he reports compliance with [place] without interruption.  Thompson Grayer MD, [place] 10/06/2020 10:07 AM

## 2020-10-07 NOTE — Anesthesia Postprocedure Evaluation (Signed)
Anesthesia Post Note  Patient: Keith Rushlow Skilling Sr.  Procedure(s) Performed: ATRIAL FIBRILLATION ABLATION     Patient location during evaluation: Cath Lab Anesthesia Type: General Level of consciousness: awake and alert Pain management: pain level controlled Vital Signs Assessment: post-procedure vital signs reviewed and stable Respiratory status: spontaneous breathing, nonlabored ventilation, respiratory function stable and patient connected to nasal cannula oxygen Cardiovascular status: blood pressure returned to baseline and stable Postop Assessment: no apparent nausea or vomiting Anesthetic complications: no   No notable events documented.  Last Vitals:  Vitals:   10/06/20 1604 10/06/20 1634  BP: 104/71 (!) 113/42  Pulse: (!) 52 62  Resp: 17 16  Temp:    SpO2: 96% 96%    Last Pain:  Vitals:   10/06/20 1420  TempSrc:   PainSc: 0-No pain                 Galaxy Borden S

## 2020-10-09 ENCOUNTER — Encounter (HOSPITAL_COMMUNITY): Payer: Self-pay | Admitting: Internal Medicine

## 2020-10-23 ENCOUNTER — Other Ambulatory Visit (HOSPITAL_COMMUNITY): Payer: Self-pay

## 2020-10-23 MED ORDER — BIOTIN 5 MG PO TABS: 5.0000 mg | ORAL_TABLET | Freq: Every day | ORAL | 0 refills | Status: AC

## 2020-11-07 ENCOUNTER — Encounter (HOSPITAL_COMMUNITY): Payer: Self-pay | Admitting: Nurse Practitioner

## 2020-11-07 ENCOUNTER — Ambulatory Visit (HOSPITAL_COMMUNITY)
Admission: RE | Admit: 2020-11-07 | Discharge: 2020-11-07 | Disposition: A | Discharge status: Home / Self Care | Payer: Medicare Other | Source: Ambulatory Visit | Attending: Nurse Practitioner | Admitting: Nurse Practitioner

## 2020-11-07 ENCOUNTER — Other Ambulatory Visit: Payer: Self-pay

## 2020-11-07 VITALS — BP 144/72 | HR 45 | Ht 71.0 in | Wt 193.0 lb

## 2020-11-07 DIAGNOSIS — Z79899 Other long term (current) drug therapy: Secondary | ICD-10-CM | POA: Diagnosis not present

## 2020-11-07 DIAGNOSIS — Z7982 Long term (current) use of aspirin: Secondary | ICD-10-CM | POA: Insufficient documentation

## 2020-11-07 DIAGNOSIS — Z8249 Family history of ischemic heart disease and other diseases of the circulatory system: Secondary | ICD-10-CM | POA: Diagnosis not present

## 2020-11-07 DIAGNOSIS — D6869 Other thrombophilia: Secondary | ICD-10-CM

## 2020-11-07 DIAGNOSIS — Z7901 Long term (current) use of anticoagulants: Secondary | ICD-10-CM | POA: Diagnosis not present

## 2020-11-07 DIAGNOSIS — I4819 Other persistent atrial fibrillation: Secondary | ICD-10-CM | POA: Diagnosis not present

## 2020-11-07 DIAGNOSIS — Z87898 Personal history of other specified conditions: Secondary | ICD-10-CM | POA: Diagnosis not present

## 2020-11-07 NOTE — Progress Notes (Signed)
Primary Care Physician: Manon Hilding, MD Referring Physician: Dr. Creola Corn D Masih Sr. is a 70 y.o. male with a h/o  GERD, HLD, bradycardia,previously seen by Dr Harl Bowie in 2017 for hx of orthostatic dizziness with symptom improvement after salt intake. Hx epiosdes of sinus bradycardia / low heart rates at times. Episodes several month apart. Pulses felt by patient at 44- 55 at baseline.    Was dx with afib with SVR in March. Was seen by Katina Dung, NP,  at that time and started on anticoagulation for a CHA2DS2VASc score of 1(age). He proceeded to cardioversion after 3 weeks of anticoagulation. It was successful but unfortunately only held for around 4 days. He saw improvement with less fatigue and dizziness with activity. EKG shows afib in the low 60's, no rate control on board. He rarely drinks alcohol, no tobacco, normal weight, exercises on a regular basis. Wife has noted some snoring but no apnea.   F/u in afib clinic, 11/07/20. He is here for f/u one month f/u ablation. He still does not have his full energy but otherwise no complaints. No swallowing or groin issues. He is in SR with HR's in the 40's but this is his norm and he is not on rate control meds. Remains on eliquis 5 mg for a CHA2DS2VASc  score of 1.   Today, he denies symptoms of palpitations, chest pain, shortness of breath, orthopnea, PND, lower extremity edema, dizziness, presyncope, syncope, or neurologic sequela. The patient is tolerating medications without difficulties and is otherwise without complaint today.   Past Medical History:  Diagnosis Date   GERD (gastroesophageal reflux disease)    Hyperlipidemia    Orthostatic lightheadedness    Persistent atrial fibrillation (HCC)    Sinus bradycardia    Past Surgical History:  Procedure Laterality Date   ATRIAL FIBRILLATION ABLATION N/A 10/06/2020   Procedure: ATRIAL FIBRILLATION ABLATION;  Surgeon: Thompson Grayer, MD;  Location: [place] [facility];  Service:  Cardiovascular;  Laterality: N/A;   CARDIOVERSION N/A 07/07/2020   Procedure: CARDIOVERSION;  Surgeon: Satira Sark, MD;  Location: [facility];  Service: Cardiovascular;  Laterality: N/A;   COLONOSCOPY     TONSILLECTOMY      Current Outpatient Medications  Medication Sig Dispense Refill   aspirin EC 81 MG tablet Take 81 mg by mouth daily. Swallow whole.     atorvastatin (LIPITOR) 20 MG tablet Take 10 mg by mouth daily.     Biotin 5 MG TABS Take 1 tablet (5 mg total) by mouth daily. 56 tablet 0   ELIQUIS 5 MG TABS tablet Take 5 mg by mouth 2 (two) times daily.     Melatonin 10 MG CAPS Take 10 mg by mouth at bedtime as needed (Sleep).     omeprazole (PRILOSEC) 20 MG capsule Take 20 mg by mouth daily.  3   No current facility-administered medications for this encounter.    No Known Allergies  Social History   Socioeconomic History   Marital status: Married    Spouse name: Camargo   Number of children: Not on file   Years of education: Not on file   Highest education level: Not on file  Occupational History   Occupation: Retired  Tobacco Use   Smoking status: Never   Smokeless tobacco: Never  Substance and Sexual Activity   Alcohol use: Yes    Alcohol/week: 1.0 standard drink    Types: 1 Cans of beer per week    Comment:  occ   Drug use: No   Sexual activity: Not on file  Other Topics Concern   Not on file  Social History Narrative   Married2 children   Lives in [place] Determinants of Health   Financial Resource Strain: Not on file  Food Insecurity: Not on file  Transportation Needs: Not on file  Physical Activity: Not on file  Stress: Not on file  Social Connections: Not on file  Intimate Partner Violence: Not on file    Family History  Problem Relation Age of Onset   Asthma Mother    Heart failure Father    Cancer Father    Heart attack Sister    Hyperlipidemia Sister     ROS- All systems are reviewed and negative except as per the HPI  above  Physical Exam: Vitals:   11/07/20 0921  BP: (!) 144/72  Pulse: (!) 45  Weight: 87.5 kg  Height: '5\' 11"'$  (1.803 m)   Wt Readings from Last 3 Encounters:  11/07/20 87.5 kg  10/06/20 88 kg  08/21/20 88.5 kg    Labs: Lab Results  Component Value Date   NA 139 09/18/2020   K 4.3 09/18/2020   CL 105 09/18/2020   CO2 26 09/18/2020   GLUCOSE 104 (H) 09/18/2020   BUN 17 09/18/2020   CREATININE 1.31 (H) 09/18/2020   CALCIUM 9.9 09/18/2020   MG 2.0 04/26/2014   No results found for: INR Lab Results  Component Value Date   CHOL 171 04/26/2014   HDL 42 04/26/2014   LDLCALC 104 (H) 04/26/2014   TRIG 127 04/26/2014     GEN- The patient is well appearing, alert and oriented x 3 today.   Head- normocephalic, atraumatic Eyes-  Sclera clear, conjunctiva pink Ears- hearing intact Oropharynx- clear Neck- supple, no JVP Lymph- no cervical lymphadenopathy Lungs- Clear to ausculation bilaterally, normal work of breathing Heart- slow regular rate and rhythm, no murmurs, rubs or gallops, PMI not laterally displaced GI- soft, NT, ND, + BS Extremities- no clubbing, cyanosis, or edema MS- no significant deformity or atrophy Skin- no rash or lesion Psych- euthymic mood, full affect Neuro- strength and sensation are intact  EKG-  sinus brady at 45 bpm, pr int 170 ms, qrs int 84 ms, qtc 392 ms    Echo- . Left ventricular ejection fraction, by estimation, is 55 to 60%. The  left ventricle has normal function. The left ventricle has no regional  wall motion abnormalities. Left ventricular diastolic parameters are  indeterminate. The average left  ventricular global longitudinal strain is -17.7 %. The global longitudinal  strain is normal.   2. Right ventricular systolic function is normal. The right ventricular  size is normal.   3. Left atrial size was mildly dilated.   4. The mitral valve is normal in structure. Trivial mitral valve  regurgitation. No evidence of mitral  stenosis.   5. The aortic valve is tricuspid. There is mild calcification of the  aortic valve. There is mild thickening of the aortic valve. Aortic valve  regurgitation is mild. No aortic stenosis is present.   6. The inferior vena cava is normal in size with greater than 50%  respiratory variability, suggesting right atrial pressure of 3 mmHg.   Assessment and Plan: 1. Persistent afib S/p one ablation one month ago SVR without rate control on board, pt states this is his normal    2. CHA2DS2VASc score of 1 Continue eliquis 5 mg bid  without interruption  F/u with Dr. Rayann Heman 01/15/21  Geroge Baseman. Carlyn Lemke, [facility] [address] [place], [place] [address] [phone]

## 2020-12-20 DIAGNOSIS — Z23 Encounter for immunization: Secondary | ICD-10-CM | POA: Diagnosis not present

## 2020-12-20 DIAGNOSIS — Z20828 Contact with and (suspected) exposure to other viral communicable diseases: Secondary | ICD-10-CM | POA: Diagnosis not present

## 2021-01-15 ENCOUNTER — Other Ambulatory Visit: Payer: Self-pay

## 2021-01-15 ENCOUNTER — Ambulatory Visit (INDEPENDENT_AMBULATORY_CARE_PROVIDER_SITE_OTHER): Payer: Medicare Other | Admitting: Internal Medicine

## 2021-01-15 ENCOUNTER — Encounter: Payer: Self-pay | Admitting: Internal Medicine

## 2021-01-15 VITALS — BP 122/64 | HR 46 | Ht 71.0 in | Wt 194.0 lb

## 2021-01-15 DIAGNOSIS — I4819 Other persistent atrial fibrillation: Secondary | ICD-10-CM | POA: Diagnosis not present

## 2021-01-15 DIAGNOSIS — R001 Bradycardia, unspecified: Secondary | ICD-10-CM

## 2021-01-15 NOTE — Progress Notes (Signed)
   PCP: Manon Hilding, MD Primary Cardiologist: [name] is a 70 y.o. male who presents today for routine electrophysiology followup.  Since his recent afib ablation, the patient reports doing very well.  he denies procedure related complications and is pleased with the results of the procedure.  Today, he denies symptoms of palpitations, chest pain, shortness of breath,  lower extremity edema, dizziness, presyncope, or syncope.  The patient is otherwise without complaint today.   Past Medical History:  Diagnosis Date   GERD (gastroesophageal reflux disease)    Hyperlipidemia    Orthostatic lightheadedness    Persistent atrial fibrillation (HCC)    Sinus bradycardia    Past Surgical History:  Procedure Laterality Date   ATRIAL FIBRILLATION ABLATION N/A 10/06/2020   Procedure: ATRIAL FIBRILLATION ABLATION;  Surgeon: Keith Grayer, MD;  Location: [place] [facility];  Service: Cardiovascular;  Laterality: N/A;   CARDIOVERSION N/A 07/07/2020   Procedure: CARDIOVERSION;  Surgeon: Satira Sark, MD;  Location: [facility];  Service: Cardiovascular;  Laterality: N/A;   COLONOSCOPY     TONSILLECTOMY      ROS- all systems are personally reviewed and negatives except as per HPI above  Current Outpatient Medications  Medication Sig Dispense Refill   aspirin EC 81 MG tablet Take 81 mg by mouth daily. Swallow whole.     atorvastatin (LIPITOR) 20 MG tablet Take 10 mg by mouth daily.     Biotin 5 MG TABS Take 1 tablet (5 mg total) by mouth daily. 56 tablet 0   ELIQUIS 5 MG TABS tablet Take 5 mg by mouth 2 (two) times daily.     Melatonin 10 MG CAPS Take 10 mg by mouth at bedtime as needed (Sleep).     omeprazole (PRILOSEC) 20 MG capsule Take 20 mg by mouth daily.  3   No current facility-administered medications for this visit.    Physical Exam: Vitals:   01/15/21 1048  BP: 122/64  Pulse: (!) 46  SpO2: 96%  Weight: 194 lb (88 kg)  Height: 5\' 11"  (1.803 m)     GEN- The patient is well appearing, alert and oriented x 3 today.   Head- normocephalic, atraumatic Eyes-  Sclera clear, conjunctiva pink Ears- hearing intact Oropharynx- clear Lungs- Clear to ausculation bilaterally, normal work of breathing Heart- Regular rate and rhythm, no murmurs, rubs or gallops, PMI not laterally displaced GI- soft, NT, ND, + BS Extremities- no clubbing, cyanosis, or edema  EKG tracing ordered today is personally reviewed and shows sinus bradycardia 46 bpm  Assessment and Plan:  1. Persistent atrial fibrillation Doing well s/p ablation chads2vasc score is 1.  We will stop eliquis today   2. Sinus bradycardia Asymptomatic He does not wish to consider PPM at this time   Return to see AF clinic in 6 months  Keith Grayer MD, [facility] 01/15/2021 10:57 AM

## 2021-01-15 NOTE — Patient Instructions (Addendum)
Medication Instructions:  Stop Eliquis  Stop Aspirin  Your physician recommends that you continue on your current medications as directed. Please refer to the Current Medication list given to you today. *If you need a refill on your cardiac medications before your next appointment, please call your pharmacy*  Lab Work: None. If you have labs (blood work) drawn today and your tests are completely normal, you will receive your results only by: [place] (if you have MyChart) OR A paper copy in the mail If you have any lab test that is abnormal or we need to change your treatment, we will call you to review the results.  Testing/Procedures: None.  Follow-Up: At [facility], you and your health needs are our priority.  As part of our continuing mission to provide you with exceptional heart care, we have created designated Provider Care Teams.  These Care Teams include your primary Cardiologist (physician) and Advanced Practice Providers (APPs -  Physician Assistants and Nurse Practitioners) who all work together to provide you with the care you need, when you need it.  Your physician wants you to follow-up in: 6 months with the Afib Clinic. They will contact you to schedule.   We recommend signing up for the patient portal called "MyChart".  Sign up information is provided on this After Visit Summary.  MyChart is used to connect with patients for Virtual Visits (Telemedicine).  Patients are able to view lab/test results, encounter notes, upcoming appointments, etc.  Non-urgent messages can be sent to your provider as well.   To learn more about what you can do with MyChart, go to [email or web address].    Any Other Special Instructions Will Be Listed Below (If Applicable).

## 2021-07-19 ENCOUNTER — Encounter (HOSPITAL_COMMUNITY): Payer: Self-pay | Admitting: Nurse Practitioner

## 2021-07-19 ENCOUNTER — Ambulatory Visit (HOSPITAL_COMMUNITY)
Admission: RE | Admit: 2021-07-19 | Discharge: 2021-07-19 | Disposition: A | Discharge status: Home / Self Care | Payer: Medicare Other | Source: Ambulatory Visit | Attending: Nurse Practitioner | Admitting: Nurse Practitioner

## 2021-07-19 VITALS — BP 120/64 | HR 58 | Ht 71.0 in | Wt 194.4 lb

## 2021-07-19 DIAGNOSIS — I4819 Other persistent atrial fibrillation: Secondary | ICD-10-CM | POA: Diagnosis not present

## 2021-07-19 DIAGNOSIS — I4891 Unspecified atrial fibrillation: Secondary | ICD-10-CM | POA: Diagnosis not present

## 2021-07-19 DIAGNOSIS — E785 Hyperlipidemia, unspecified: Secondary | ICD-10-CM | POA: Diagnosis not present

## 2021-07-19 DIAGNOSIS — K219 Gastro-esophageal reflux disease without esophagitis: Secondary | ICD-10-CM | POA: Diagnosis not present

## 2021-07-19 NOTE — Progress Notes (Signed)
? ?Primary Care Physician: Manon Hilding, MD ?Referring Physician: Dr. Domenic Polite ? ? ?Keith Curling Hampshire Sr. is a 71 y.o. male with a h/o  GERD, HLD, bradycardia,previously seen by Dr Harl Bowie in 2017 for hx of orthostatic dizziness with symptom improvement after salt intake. Hx epiosdes of sinus bradycardia / low heart rates at times. Episodes several month apart. Pulses felt by patient at 44- 55 at baseline.   ? ?Was dx with afib with SVR in March. Was seen by Katina Dung, NP,  at that time and started on anticoagulation for a CHA2DS2VASc score of 1(age). He proceeded to cardioversion after 3 weeks of anticoagulation. It was successful but unfortunately only held for around 4 days. He saw improvement with less fatigue and dizziness with activity. EKG shows afib in the low 60's, no rate control on board. He rarely drinks alcohol, no tobacco, normal weight, exercises on a regular basis. Wife has noted some snoring but no apnea.  ? ?F/u in afib clinic, 11/07/20. He is here for f/u one month f/u ablation. He still does not have his full energy but otherwise no complaints. No swallowing or groin issues. He is in SR with HR's in the 40's but this is his norm and he is not on rate control meds. Remains on eliquis 5 mg for a CHA2DS2VASc  score of 1.  ? ?F/u afib clinic, 07/19/21.  He has not noted any afib since ablation summer of 2022. He is off anticoagulation for CHA2DS2VASc  of 0. He had f/u with Dr. Rayann Heman last fall.  ? ?Today, he denies symptoms of palpitations, chest pain, shortness of breath, orthopnea, PND, lower extremity edema, dizziness, presyncope, syncope, or neurologic sequela. The patient is tolerating medications without difficulties and is otherwise without complaint today.  ? ?Past Medical History:  ?Diagnosis Date  ? GERD (gastroesophageal reflux disease)   ? Hyperlipidemia   ? Orthostatic lightheadedness   ? Persistent atrial fibrillation ([place])   ? Sinus bradycardia   ? ?Past Surgical History:  ?Procedure  Laterality Date  ? ATRIAL FIBRILLATION ABLATION N/A 10/06/2020  ? Procedure: ATRIAL FIBRILLATION ABLATION;  Surgeon: Thompson Grayer, MD;  Location: [place] [facility];  Service: Cardiovascular;  Laterality: N/A;  ? CARDIOVERSION N/A 07/07/2020  ? Procedure: CARDIOVERSION;  Surgeon: Satira Sark, MD;  Location: [facility];  Service: Cardiovascular;  Laterality: N/A;  ? COLONOSCOPY    ? TONSILLECTOMY    ? ? ?Current Outpatient Medications  ?Medication Sig Dispense Refill  ? atorvastatin (LIPITOR) 20 MG tablet Take 10 mg by mouth daily.    ? Biotin 5 MG TABS Take 1 tablet (5 mg total) by mouth daily. 56 tablet 0  ? Melatonin 10 MG CAPS Take 10 mg by mouth at bedtime as needed (Sleep).    ? omeprazole (PRILOSEC) 20 MG capsule Take 20 mg by mouth daily.  3  ? ?No current facility-administered medications for this encounter.  ? ? ?No Known Allergies ? ?Social History  ? ?Socioeconomic History  ? Marital status: Married  ?  Spouse name: Keith Luna  ? Number of children: Not on file  ? Years of education: Not on file  ? Highest education level: Not on file  ?Occupational History  ? Occupation: Retired  ?Tobacco Use  ? Smoking status: Never  ? Smokeless tobacco: Never  ?Substance and Sexual Activity  ? Alcohol use: Yes  ?  Alcohol/week: 1.0 standard drink  ?  Types: 1 Cans of beer per week  ?  Comment:  occ  ? Drug use: No  ? Sexual activity: Not on file  ?Other Topics Concern  ? Not on file  ?Social History Narrative  ? Married2 children  ? Lives in [place]  ? ?Social Determinants of Health  ? ?Financial Resource Strain: Not on file  ?Food Insecurity: Not on file  ?Transportation Needs: Not on file  ?Physical Activity: Not on file  ?Stress: Not on file  ?Social Connections: Not on file  ?Intimate Partner Violence: Not on file  ? ? ?Family History  ?Problem Relation Age of Onset  ? Asthma Mother   ? Heart failure Father   ? Cancer Father   ? Heart attack Sister   ? Hyperlipidemia Sister   ? ? ?ROS- All systems are reviewed and negative  except as per the HPI above ? ?Physical Exam: ?There were no vitals filed for this visit. ? ?Wt Readings from Last 3 Encounters:  ?01/15/21 88 kg  ?11/07/20 87.5 kg  ?10/06/20 88 kg  ? ? ?Labs: ?Lab Results  ?Component Value Date  ? NA 139 09/18/2020  ? K 4.3 09/18/2020  ? CL 105 09/18/2020  ? CO2 26 09/18/2020  ? GLUCOSE 104 (H) 09/18/2020  ? BUN 17 09/18/2020  ? CREATININE 1.31 (H) 09/18/2020  ? CALCIUM 9.9 09/18/2020  ? MG 2.0 04/26/2014  ? ?No results found for: INR ?Lab Results  ?Component Value Date  ? CHOL 171 04/26/2014  ? HDL 42 04/26/2014  ? LDLCALC 104 (H) 04/26/2014  ? TRIG 127 04/26/2014  ? ? ? ?GEN- The patient is well appearing, alert and oriented x 3 today.   ?Head- normocephalic, atraumatic ?Eyes-  Sclera clear, conjunctiva pink ?Ears- hearing intact ?Oropharynx- clear ?Neck- supple, no JVP ?Lymph- no cervical lymphadenopathy ?Lungs- Clear to ausculation bilaterally, normal work of breathing ?Heart- slow regular rate and rhythm, no murmurs, rubs or gallops, PMI not laterally displaced ?GI- soft, NT, ND, + BS ?Extremities- no clubbing, cyanosis, or edema ?MS- no significant deformity or atrophy ?Skin- no rash or lesion ?Psych- euthymic mood, full affect ?Neuro- strength and sensation are intact ? ?EKG sinus brady at 58 bpm, pr int 168 ms, qrs int 80 ms, qtc 418 ms  ? ?Echo- . Left ventricular ejection fraction, by estimation, is 55 to 60%. The  ?left ventricle has normal function. The left ventricle has no regional  ?wall motion abnormalities. Left ventricular diastolic parameters are  ?indeterminate. The average left  ?ventricular global longitudinal strain is -17.7 %. The global longitudinal  ?strain is normal.  ? 2. Right ventricular systolic function is normal. The right ventricular  ?size is normal.  ? 3. Left atrial size was mildly dilated.  ? 4. The mitral valve is normal in structure. Trivial mitral valve  ?regurgitation. No evidence of mitral stenosis.  ? 5. The aortic valve is tricuspid.  There is mild calcification of the  ?aortic valve. There is mild thickening of the aortic valve. Aortic valve  ?regurgitation is mild. No aortic stenosis is present.  ? 6. The inferior vena cava is normal in size with greater than 50%  ?respiratory variability, suggesting right atrial pressure of 3 mmHg.  ? ?Assessment and Plan: ?1. Persistent afib ?S/p one ablation summer of 2022 ?No afib since procedure  ?Doing very well staying in SR  ? ?2. CHA2DS2VASc score of 1 ?Now off eliquis per guidelines  ? ?F/u with afib clinic as needed  ? ?Geroge Baseman Kayleen Memos, ANP-C ?Afib Clinic ?[facility] ?[address] ?  [place], [place] [address] ?[phone] ? ? ?

## 2021-07-25 DIAGNOSIS — E7849 Other hyperlipidemia: Secondary | ICD-10-CM | POA: Diagnosis not present

## 2021-07-25 DIAGNOSIS — K2100 Gastro-esophageal reflux disease with esophagitis, without bleeding: Secondary | ICD-10-CM | POA: Diagnosis not present

## 2021-07-25 DIAGNOSIS — E782 Mixed hyperlipidemia: Secondary | ICD-10-CM | POA: Diagnosis not present

## 2021-07-31 DIAGNOSIS — Z6826 Body mass index (BMI) 26.0-26.9, adult: Secondary | ICD-10-CM | POA: Diagnosis not present

## 2021-07-31 DIAGNOSIS — R7301 Impaired fasting glucose: Secondary | ICD-10-CM | POA: Diagnosis not present

## 2021-07-31 DIAGNOSIS — E7849 Other hyperlipidemia: Secondary | ICD-10-CM | POA: Diagnosis not present

## 2021-07-31 DIAGNOSIS — K429 Umbilical hernia without obstruction or gangrene: Secondary | ICD-10-CM | POA: Diagnosis not present

## 2021-07-31 DIAGNOSIS — N401 Enlarged prostate with lower urinary tract symptoms: Secondary | ICD-10-CM | POA: Diagnosis not present

## 2021-07-31 DIAGNOSIS — Z23 Encounter for immunization: Secondary | ICD-10-CM | POA: Diagnosis not present

## 2021-07-31 DIAGNOSIS — D128 Benign neoplasm of rectum: Secondary | ICD-10-CM | POA: Diagnosis not present

## 2022-01-23 DIAGNOSIS — Z23 Encounter for immunization: Secondary | ICD-10-CM | POA: Diagnosis not present

## 2022-04-18 ENCOUNTER — Encounter (HOSPITAL_COMMUNITY): Payer: Self-pay | Admitting: *Deleted

## 2022-08-02 DIAGNOSIS — E7849 Other hyperlipidemia: Secondary | ICD-10-CM | POA: Diagnosis not present

## 2022-08-02 DIAGNOSIS — K2100 Gastro-esophageal reflux disease with esophagitis, without bleeding: Secondary | ICD-10-CM | POA: Diagnosis not present

## 2022-08-02 DIAGNOSIS — Z0001 Encounter for general adult medical examination with abnormal findings: Secondary | ICD-10-CM | POA: Diagnosis not present

## 2022-08-07 DIAGNOSIS — Z1389 Encounter for screening for other disorder: Secondary | ICD-10-CM | POA: Diagnosis not present

## 2022-08-07 DIAGNOSIS — N401 Enlarged prostate with lower urinary tract symptoms: Secondary | ICD-10-CM | POA: Diagnosis not present

## 2022-08-07 DIAGNOSIS — Z1331 Encounter for screening for depression: Secondary | ICD-10-CM | POA: Diagnosis not present

## 2022-08-07 DIAGNOSIS — K429 Umbilical hernia without obstruction or gangrene: Secondary | ICD-10-CM | POA: Diagnosis not present

## 2022-08-07 DIAGNOSIS — R7301 Impaired fasting glucose: Secondary | ICD-10-CM | POA: Diagnosis not present

## 2022-08-07 DIAGNOSIS — Z6826 Body mass index (BMI) 26.0-26.9, adult: Secondary | ICD-10-CM | POA: Diagnosis not present

## 2022-08-07 DIAGNOSIS — K2100 Gastro-esophageal reflux disease with esophagitis, without bleeding: Secondary | ICD-10-CM | POA: Diagnosis not present

## 2022-08-07 DIAGNOSIS — N1832 Chronic kidney disease, stage 3b: Secondary | ICD-10-CM | POA: Diagnosis not present

## 2022-08-07 DIAGNOSIS — E7849 Other hyperlipidemia: Secondary | ICD-10-CM | POA: Diagnosis not present

## 2022-08-07 DIAGNOSIS — R030 Elevated blood-pressure reading, without diagnosis of hypertension: Secondary | ICD-10-CM | POA: Diagnosis not present

## 2022-08-07 DIAGNOSIS — D128 Benign neoplasm of rectum: Secondary | ICD-10-CM | POA: Diagnosis not present

## 2022-12-20 DIAGNOSIS — Z23 Encounter for immunization: Secondary | ICD-10-CM | POA: Diagnosis not present

## 2023-02-03 DIAGNOSIS — N1832 Chronic kidney disease, stage 3b: Secondary | ICD-10-CM | POA: Diagnosis not present

## 2023-02-03 DIAGNOSIS — R7301 Impaired fasting glucose: Secondary | ICD-10-CM | POA: Diagnosis not present

## 2023-02-03 DIAGNOSIS — E7849 Other hyperlipidemia: Secondary | ICD-10-CM | POA: Diagnosis not present

## 2023-02-10 DIAGNOSIS — N401 Enlarged prostate with lower urinary tract symptoms: Secondary | ICD-10-CM | POA: Diagnosis not present

## 2023-02-10 DIAGNOSIS — D128 Benign neoplasm of rectum: Secondary | ICD-10-CM | POA: Diagnosis not present

## 2023-02-10 DIAGNOSIS — K429 Umbilical hernia without obstruction or gangrene: Secondary | ICD-10-CM | POA: Diagnosis not present

## 2023-02-10 DIAGNOSIS — E7849 Other hyperlipidemia: Secondary | ICD-10-CM | POA: Diagnosis not present

## 2023-02-10 DIAGNOSIS — R7301 Impaired fasting glucose: Secondary | ICD-10-CM | POA: Diagnosis not present

## 2023-02-10 DIAGNOSIS — N1832 Chronic kidney disease, stage 3b: Secondary | ICD-10-CM | POA: Diagnosis not present

## 2023-02-10 DIAGNOSIS — Z6826 Body mass index (BMI) 26.0-26.9, adult: Secondary | ICD-10-CM | POA: Diagnosis not present

## 2023-02-10 DIAGNOSIS — R030 Elevated blood-pressure reading, without diagnosis of hypertension: Secondary | ICD-10-CM | POA: Diagnosis not present

## 2023-02-10 DIAGNOSIS — K2100 Gastro-esophageal reflux disease with esophagitis, without bleeding: Secondary | ICD-10-CM | POA: Diagnosis not present

## 2023-08-13 DIAGNOSIS — Z6825 Body mass index (BMI) 25.0-25.9, adult: Secondary | ICD-10-CM | POA: Diagnosis not present

## 2023-08-13 DIAGNOSIS — R5383 Other fatigue: Secondary | ICD-10-CM | POA: Diagnosis not present

## 2023-08-13 DIAGNOSIS — E782 Mixed hyperlipidemia: Secondary | ICD-10-CM | POA: Diagnosis not present

## 2023-08-13 DIAGNOSIS — I4891 Unspecified atrial fibrillation: Secondary | ICD-10-CM | POA: Diagnosis not present

## 2023-08-13 DIAGNOSIS — G459 Transient cerebral ischemic attack, unspecified: Secondary | ICD-10-CM | POA: Diagnosis not present

## 2024-01-19 DIAGNOSIS — Z23 Encounter for immunization: Secondary | ICD-10-CM | POA: Diagnosis not present

## 2024-02-17 DIAGNOSIS — Z1329 Encounter for screening for other suspected endocrine disorder: Secondary | ICD-10-CM | POA: Diagnosis not present

## 2024-02-17 DIAGNOSIS — R5383 Other fatigue: Secondary | ICD-10-CM | POA: Diagnosis not present

## 2024-02-17 DIAGNOSIS — E7849 Other hyperlipidemia: Secondary | ICD-10-CM | POA: Diagnosis not present

## 2024-02-17 DIAGNOSIS — E559 Vitamin D deficiency, unspecified: Secondary | ICD-10-CM | POA: Diagnosis not present

## 2024-02-17 DIAGNOSIS — N1832 Chronic kidney disease, stage 3b: Secondary | ICD-10-CM | POA: Diagnosis not present

## 2024-02-25 ENCOUNTER — Encounter (INDEPENDENT_AMBULATORY_CARE_PROVIDER_SITE_OTHER): Payer: Self-pay | Admitting: *Deleted

## 2024-03-17 ENCOUNTER — Telehealth (INDEPENDENT_AMBULATORY_CARE_PROVIDER_SITE_OTHER): Payer: Self-pay

## 2024-03-17 NOTE — Telephone Encounter (Signed)
Ok to schedule.  Room :Any   Thanks,  Vista Lawman, MD Gastroenterology and Hepatology [facility]

## 2024-03-17 NOTE — Telephone Encounter (Signed)
 Who is your primary care physician: Dr. Atilano with [facility]  Reasons for the colonoscopy: history of colon polyps  Have you had a colonoscopy before?  Yes, 2020  Do you have family history of colon cancer? no  Previous colonoscopy with polyps removed? Yes, 2020  Do you have a history colorectal cancer?   no  Are you diabetic? If yes, Type 1 or Type 2?    no  Do you have a prosthetic or mechanical heart valve? no  Do you have a pacemaker/defibrillator?   no  Have you had endocarditis/atrial fibrillation? no  Have you had joint replacement within the last 12 months?  no  Do you tend to be constipated or have to use laxatives? no  Do you have any history of drugs or alcohol ?  no  Do you use supplemental oxygen?  no  Have you had a stroke or heart attack within the last 6 months? no  Do you take weight loss medication?  no  Do you take any blood-thinning medications such as: (aspirin, warfarin, Plavix, Aggrenox)  no  If yes we need the name, milligram, dosage and who is prescribing doctor   Current Outpatient Medications  Medication Sig Dispense Refill   atorvastatin  (LIPITOR) 20 MG tablet Take 10 mg by mouth daily.     Biotin  5 MG TABS Take 1 tablet (5 mg total) by mouth daily. 56 tablet 0   Magnesium  250 MG CAPS Take by mouth.     omeprazole  (PRILOSEC) 20 MG capsule Take 20 mg by mouth daily.  3   No current facility-administered medications for this visit.    Allergies[1]  Pharm[facility]  Primary Insurance Name: Medicare  Best number where you can be reached: [phone]      [1] No Known Allergies

## 2024-03-18 NOTE — Telephone Encounter (Signed)
 LMOVM to call back to schedule with any provider

## 2024-03-24 MED ORDER — SUTAB 1479-225-188 MG PO TABS: ORAL_TABLET | ORAL | 0 refills | Status: DC

## 2024-03-24 NOTE — Telephone Encounter (Signed)
 Prior auth not required for Medicare.

## 2024-03-24 NOTE — Addendum Note (Signed)
 Addended by: DALLIE LIONEL RAMAN on: 03/24/2024 04:49 PM   Modules accepted: Orders

## 2024-03-24 NOTE — Telephone Encounter (Signed)
 Spoke with patient, scheduled TCS for 04/12/2024 at 8:30am. Rx sent to pharmacy. Instructions mailed.

## 2024-03-24 NOTE — Telephone Encounter (Signed)
 LMOVM to call back

## 2024-03-25 ENCOUNTER — Encounter (INDEPENDENT_AMBULATORY_CARE_PROVIDER_SITE_OTHER): Payer: Self-pay | Admitting: *Deleted

## 2024-03-25 NOTE — Telephone Encounter (Signed)
 Referral completed, TCS apt letter sent to PCP

## 2024-04-08 ENCOUNTER — Encounter (HOSPITAL_COMMUNITY): Payer: Self-pay

## 2024-04-08 ENCOUNTER — Encounter (HOSPITAL_COMMUNITY)
Admission: RE | Admit: 2024-04-08 | Discharge: 2024-04-08 | Disposition: A | Discharge status: Home / Self Care | Source: Ambulatory Visit | Attending: Gastroenterology | Admitting: Gastroenterology

## 2024-04-08 ENCOUNTER — Other Ambulatory Visit: Payer: Self-pay

## 2024-04-12 ENCOUNTER — Ambulatory Visit (HOSPITAL_COMMUNITY): Admitting: Anesthesiology

## 2024-04-12 ENCOUNTER — Encounter (HOSPITAL_COMMUNITY)
Admission: RE | Disposition: A | Discharge status: Home / Self Care | Payer: Self-pay | Source: Home / Self Care | Attending: Gastroenterology

## 2024-04-12 ENCOUNTER — Ambulatory Visit (HOSPITAL_COMMUNITY)
Admission: RE | Admit: 2024-04-12 | Discharge: 2024-04-12 | Disposition: A | Discharge status: Home / Self Care | Attending: Gastroenterology | Admitting: Gastroenterology

## 2024-04-12 DIAGNOSIS — D122 Benign neoplasm of ascending colon: Secondary | ICD-10-CM | POA: Diagnosis not present

## 2024-04-12 DIAGNOSIS — K648 Other hemorrhoids: Secondary | ICD-10-CM | POA: Diagnosis not present

## 2024-04-12 DIAGNOSIS — D125 Benign neoplasm of sigmoid colon: Secondary | ICD-10-CM | POA: Diagnosis not present

## 2024-04-12 DIAGNOSIS — K219 Gastro-esophageal reflux disease without esophagitis: Secondary | ICD-10-CM | POA: Insufficient documentation

## 2024-04-12 DIAGNOSIS — K635 Polyp of colon: Secondary | ICD-10-CM

## 2024-04-12 DIAGNOSIS — D120 Benign neoplasm of cecum: Secondary | ICD-10-CM | POA: Insufficient documentation

## 2024-04-12 DIAGNOSIS — D123 Benign neoplasm of transverse colon: Secondary | ICD-10-CM | POA: Diagnosis not present

## 2024-04-12 DIAGNOSIS — Z1211 Encounter for screening for malignant neoplasm of colon: Secondary | ICD-10-CM | POA: Diagnosis not present

## 2024-04-12 DIAGNOSIS — I4819 Other persistent atrial fibrillation: Secondary | ICD-10-CM | POA: Insufficient documentation

## 2024-04-12 DIAGNOSIS — K5730 Diverticulosis of large intestine without perforation or abscess without bleeding: Secondary | ICD-10-CM | POA: Insufficient documentation

## 2024-04-12 LAB — HM COLONOSCOPY

## 2024-04-12 MED ORDER — EPHEDRINE SULFATE (PRESSORS) 25 MG/5ML IV SOSY
PREFILLED_SYRINGE | INTRAVENOUS | Status: DC | PRN
  Administered 2024-04-12 (×3): 5 mg via INTRAVENOUS

## 2024-04-12 MED ORDER — PROPOFOL 500 MG/50ML IV EMUL
INTRAVENOUS | Status: DC | PRN
  Administered 2024-04-12: 200 ug/kg/min via INTRAVENOUS

## 2024-04-12 MED ORDER — PROPOFOL 10 MG/ML IV BOLUS
INTRAVENOUS | Status: DC | PRN
  Administered 2024-04-12: 100 mg via INTRAVENOUS

## 2024-04-12 MED ORDER — LACTATED RINGERS IV SOLN: INTRAVENOUS | Status: DC | PRN

## 2024-04-12 MED ORDER — SPOT INK MARKER SYRINGE KIT
PACK | SUBMUCOSAL | Status: DC | PRN
  Administered 2024-04-12: 1.3 mL via SUBMUCOSAL

## 2024-04-12 MED ORDER — SODIUM CHLORIDE (PF) 0.9 % IJ SOLN
PREFILLED_SYRINGE | INTRAVENOUS | Status: DC | PRN
  Administered 2024-04-12: 2.5 mL

## 2024-04-12 NOTE — Discharge Instructions (Signed)
   Discharge instructions Please read the instructions outlined below and refer to this sheet in the next few weeks. These discharge instructions provide you with general information on caring for yourself after you leave the hospital. Your doctor may also give you specific instructions. While your treatment has been planned according to the most current medical practices available, unavoidable complications occasionally occur. If you have any problems or questions after discharge, please call your doctor. ACTIVITY You may resume your regular activity but move at a slower pace for the next 24 hours.  Take frequent rest periods for the next 24 hours.  Walking will help expel (get rid of) the air and reduce the bloated feeling in your abdomen.  No driving for 24 hours (because of the anesthesia (medicine) used during the test).  You may shower.  Do not sign any important legal documents or operate any machinery for 24 hours (because of the anesthesia used during the test).  NUTRITION Drink plenty of fluids.  You may resume your normal diet.  Begin with a light meal and progress to your normal diet.  Avoid alcoholic beverages for 24 hours or as instructed by your caregiver.  MEDICATIONS You may resume your normal medications unless your caregiver tells you otherwise.  WHAT YOU CAN EXPECT TODAY You may experience abdominal discomfort such as a feeling of fullness or "gas" pains.  FOLLOW-UP Your doctor will discuss the results of your test with you.  SEEK IMMEDIATE MEDICAL ATTENTION IF ANY OF THE FOLLOWING OCCUR: Excessive nausea (feeling sick to your stomach) and/or vomiting.  Severe abdominal pain and distention (swelling).  Trouble swallowing.  Temperature over 101 F (37.8 C).  Rectal bleeding or vomiting of blood.    Avoid  using high dose aspirin including Goody/BC powders, NSAIDs such as Aleve, ibuprofen, naproxen, Motrin, Voltaren or Advil (even the topical ones) for 14 days   I hope  you have a great rest of your week!   Vista Lawman , M.D.. Gastroenterology and Hepatology [facility]

## 2024-04-12 NOTE — Op Note (Signed)
 [facility] Patient Name: Keith Luna Procedure Date: 04/12/2024 8:06 AM MRN: [id number] Date of Birth: July 03, 1950 Attending MD: Deatrice Dine , MD, [id number] CSN: [id number] Age: 74 Admit Type: Outpatient Procedure:                Colonoscopy Indications:              Screening for colorectal malignant neoplasm Providers:                Deatrice Dine, MD, Rosina Sprague, Daphne Mulch                            Technician, Technician Referring MD:              Medicines:                Monitored Anesthesia Care Complications:            No immediate complications. Estimated Blood Loss:     Estimated blood loss was minimal. Procedure:                Pre-Anesthesia Assessment:                           - Prior to the procedure, a History and Physical                            was performed, and patient medications and                            allergies were reviewed. The patient's tolerance of                            previous anesthesia was also reviewed. The risks                            and benefits of the procedure and the sedation                            options and risks were discussed with the patient.                            All questions were answered, and informed consent                            was obtained. Prior Anticoagulants: The patient has                            taken no anticoagulant or antiplatelet agents. ASA                            Grade Assessment: II - A patient with mild systemic                            disease. After reviewing the risks and benefits,  the patient was deemed in satisfactory condition to                            undergo the procedure.                           After obtaining informed consent, the colonoscope                            was passed under direct vision. Throughout the                            procedure, the patient's blood pressure, pulse, and                             oxygen saturations were monitored continuously. The                            [id number] ([id number]) Colon was introduced through                            the anus and advanced to the the cecum, identified                            by appendiceal orifice and ileocecal valve. The                            ileocecal valve, appendiceal orifice, and rectum                            were photographed. Scope In: 8:38:13 AM Scope Out: 9:20:27 AM Scope Withdrawal Time: 0 hours 38 minutes 51 seconds  Total Procedure Duration: 0 hours 42 minutes 14 seconds  Findings:      The perianal and digital rectal examinations were normal.      A 14 mm polyp was found in the cecum. The polyp was sessile. The polyp       was removed with a cold snare. Resection and retrieval were complete.      Five sessile polyps were found in the transverse colon and ascending       colon. The polyps were 4 to 6 mm in size. These polyps were removed with       a cold snare. Resection and retrieval were complete.      A 20 mm polyp was found in the sigmoid colon. The polyp was [place]       classification Is (protruding, sessile). Preparations were made for       mucosal resection. Demarcation of the lesion was performed with       high-definition white light and narrow band imaging to clearly identify       the boundaries of the lesion. A 0.1 mg/mL solution of epinephrine  was       injected to raise the lesion. Piecemeal mucosal resection using a snare       was performed. Resection was incomplete. The resected tissue was       retrieved. Resected tissue Polyp appears to be originating from a  diverticuli bed (SEE PICTURES). To close a defect after polypectomy,       three hemostatic clips were successfully placed (MR conditional). Clip       manufacturer: [employer]. There was no bleeding at the end of the       procedure. Area was tattooed with an injection of 2 mL of Spot (carbon       black).       Scattered medium-mouthed diverticula were found in the left colon.      Non-bleeding internal hemorrhoids were found during retroflexion. The       hemorrhoids were small. Impression:               - One 14 mm polyp in the cecum, removed with a cold                            snare. Resected and retrieved.                           - Five 4 to 6 mm polyps in the transverse colon and                            in the ascending colon, removed with a cold snare.                            Resected and retrieved.                           - One 20 mm polyp in the sigmoid colon, removed                            with mucosal resection. Incomplete resection as                            polyp tissues was seen on the Diverticuli bed , as                            risk for perforation being high within the                            diverticuli that particular polyp tissue was not                            removed. Resected tissue retrieved. Clips (MR                            conditional) were placed. Clip manufacturer: [employer]. Tattooed.                           - Diverticulosis in the left colon.                           - Non-bleeding internal hemorrhoids.                           -  Mucosal resection was performed. Resection was                            incomplete. The resected tissue was retrieved. Moderate Sedation:      Per Anesthesia Care Recommendation:           - Patient has a contact number available for                            emergencies. The signs and symptoms of potential                            delayed complications were discussed with the                            patient. Return to normal activities tomorrow.                            Written discharge instructions were provided to the                            patient.                           - Resume previous diet.                           - Continue present  medications.                           - Await pathology results.                           - Repeat colonoscopy for surveillance based on                            pathology results.                           - Return to GI clinic as previously scheduled.                           -if Sigmoid polyp returns as tubular adenoma                            (pre-malignant) would recommend evaluation for                            sigmoidectomy Procedure Code(s):        --- Professional ---                           [id number], Colonoscopy, flexible; with endoscopic                            mucosal resection  54614, 59, Colonoscopy, flexible; with removal of                            tumor(s), polyp(s), or other lesion(s) by snare                            technique Diagnosis Code(s):        --- Professional ---                           Z12.11, Encounter for screening for malignant                            neoplasm of colon                           D12.0, Benign neoplasm of cecum                           D12.5, Benign neoplasm of sigmoid colon                           D12.3, Benign neoplasm of transverse colon (hepatic                            flexure or splenic flexure)                           D12.2, Benign neoplasm of ascending colon                           K64.8, Other hemorrhoids                           K57.30, Diverticulosis of large intestine without                            perforation or abscess without bleeding CPT copyright 2022 American Medical Association. All rights reserved. The codes documented in this report are preliminary and upon coder review may  be revised to meet current compliance requirements. Deatrice Dine, MD Deatrice Dine, MD 04/12/2024 9:32:52 AM This report has been signed electronically. Number of Addenda: 0

## 2024-04-12 NOTE — Anesthesia Preprocedure Evaluation (Signed)
 Anesthesia Evaluation  Patient identified by MRN, date of birth, ID band Patient awake    Reviewed: Allergy & Precautions, H&P , NPO status , Patient's Chart, lab work & pertinent test results, reviewed documented beta blocker date and time   Airway Mallampati: II  TM Distance: >3 FB Neck ROM: full    Dental no notable dental hx.    Pulmonary neg pulmonary ROS   Pulmonary exam normal breath sounds clear to auscultation       Cardiovascular Exercise Tolerance: Good hypertension, negative cardio ROS  Rhythm:regular Rate:Normal     Neuro/Psych negative neurological ROS  negative psych ROS   GI/Hepatic Neg liver ROS,GERD  ,,  Endo/Other  negative endocrine ROS    Renal/GU negative Renal ROS  negative genitourinary   Musculoskeletal   Abdominal   Peds  Hematology negative hematology ROS (+)   Anesthesia Other Findings   Reproductive/Obstetrics negative OB ROS                              Anesthesia Physical Anesthesia Plan  ASA: 2  Anesthesia Plan: MAC   Post-op Pain Management:    Induction:   PONV Risk Score and Plan: Propofol infusion  Airway Management Planned:   Additional Equipment:   Intra-op Plan:   Post-operative Plan:   Informed Consent: I have reviewed the patients History and Physical, chart, labs and discussed the procedure including the risks, benefits and alternatives for the proposed anesthesia with the patient or authorized representative who has indicated his/her understanding and acceptance.     Dental Advisory Given  Plan Discussed with: CRNA  Anesthesia Plan Comments:         Anesthesia Quick Evaluation

## 2024-04-13 NOTE — Anesthesia Postprocedure Evaluation (Signed)
"   Anesthesia Post Note  Patient: Keith Aungst Orozco Sr.  Procedure(s) Performed: COLONOSCOPY POLYPECTOMY, INTESTINE SCLEROTHERAPY INJECTION, SUBMUCOSAL CONTROL OF HEMORRHAGE, GI TRACT, ENDOSCOPIC, BY CLIPPING OR OVERSEWING  Patient location during evaluation: Phase II Anesthesia Type: MAC Level of consciousness: awake Pain management: pain level controlled Vital Signs Assessment: post-procedure vital signs reviewed and stable Respiratory status: spontaneous breathing and respiratory function stable Cardiovascular status: blood pressure returned to baseline and stable Postop Assessment: no headache and no apparent nausea or vomiting Anesthetic complications: no Comments: Late entry   No notable events documented.   Last Vitals:  Vitals:   04/12/24 0925 04/12/24 0929  BP: 98/66 122/72  Pulse: 61   Resp: 19   Temp: 36.7 C   SpO2: 100%     Last Pain:  Vitals:   04/13/24 0937  TempSrc:   PainSc: 1                  Yvonna PARAS Lilyanne Mcquown      "

## 2024-04-14 ENCOUNTER — Encounter (INDEPENDENT_AMBULATORY_CARE_PROVIDER_SITE_OTHER): Payer: Self-pay | Admitting: *Deleted

## 2024-04-14 LAB — SURGICAL PATHOLOGY
